# Patient Record
Sex: Male | Born: 1985 | Race: Black or African American | Hispanic: No | Marital: Single | Smoking: Current every day smoker
Health system: Southern US, Community
[De-identification: ages and names within clinical notes are randomized; demographics above are authoritative.]

---
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
## 2006-08-17 ENCOUNTER — Emergency Department (HOSPITAL_COMMUNITY): Admission: EM | Admit: 2006-08-17 | Discharge: 2006-08-17 | Payer: Self-pay | Admitting: Emergency Medicine

## 2008-03-04 ENCOUNTER — Emergency Department (HOSPITAL_COMMUNITY): Admission: EM | Admit: 2008-03-04 | Discharge: 2008-03-04 | Payer: Self-pay | Admitting: Emergency Medicine

## 2010-07-01 ENCOUNTER — Emergency Department: Payer: Self-pay | Admitting: Emergency Medicine

## 2011-10-21 ENCOUNTER — Emergency Department: Payer: Self-pay | Admitting: Emergency Medicine

## 2013-04-05 ENCOUNTER — Emergency Department (HOSPITAL_COMMUNITY): Payer: Self-pay

## 2013-04-05 ENCOUNTER — Encounter (HOSPITAL_COMMUNITY): Payer: Self-pay | Admitting: *Deleted

## 2013-04-05 ENCOUNTER — Emergency Department (HOSPITAL_COMMUNITY)
Admission: EM | Admit: 2013-04-05 | Discharge: 2013-04-05 | Disposition: A | Discharge status: Home / Self Care | Payer: Self-pay | Attending: Emergency Medicine | Admitting: Emergency Medicine

## 2013-04-05 DIAGNOSIS — F17200 Nicotine dependence, unspecified, uncomplicated: Secondary | ICD-10-CM | POA: Insufficient documentation

## 2013-04-05 DIAGNOSIS — S93409A Sprain of unspecified ligament of unspecified ankle, initial encounter: Secondary | ICD-10-CM | POA: Insufficient documentation

## 2013-04-05 DIAGNOSIS — X500XXA Overexertion from strenuous movement or load, initial encounter: Secondary | ICD-10-CM | POA: Insufficient documentation

## 2013-04-05 DIAGNOSIS — S93401A Sprain of unspecified ligament of right ankle, initial encounter: Secondary | ICD-10-CM

## 2013-04-05 DIAGNOSIS — M2391 Unspecified internal derangement of right knee: Secondary | ICD-10-CM

## 2013-04-05 DIAGNOSIS — Y929 Unspecified place or not applicable: Secondary | ICD-10-CM | POA: Insufficient documentation

## 2013-04-05 DIAGNOSIS — Y9302 Activity, running: Secondary | ICD-10-CM | POA: Insufficient documentation

## 2013-04-05 DIAGNOSIS — M2390 Unspecified internal derangement of unspecified knee: Secondary | ICD-10-CM | POA: Insufficient documentation

## 2013-04-05 DIAGNOSIS — W010XXA Fall on same level from slipping, tripping and stumbling without subsequent striking against object, initial encounter: Secondary | ICD-10-CM | POA: Insufficient documentation

## 2013-04-05 MED ORDER — TRAMADOL HCL 50 MG PO TABS: 100.0000 mg | ORAL_TABLET | Freq: Four times a day (QID) | ORAL | Status: DC | PRN

## 2013-04-05 MED ORDER — TRAMADOL HCL 50 MG PO TABS
100.0000 mg | ORAL_TABLET | Freq: Once | ORAL | Status: AC
  Administered 2013-04-05: 100 mg via ORAL
  Filled 2013-04-05: qty 2

## 2013-04-05 MED ORDER — IBUPROFEN 800 MG PO TABS
800.0000 mg | ORAL_TABLET | Freq: Once | ORAL | Status: AC
  Administered 2013-04-05: 800 mg via ORAL
  Filled 2013-04-05: qty 1

## 2013-04-05 NOTE — ED Notes (Signed)
Running and tripped last night, Pain rt ankle , lat malleolus

## 2013-04-05 NOTE — ED Notes (Signed)
Pt alert & oriented x4, stable gait. Patient given discharge instructions, paperwork & prescription(s). Patient  instructed to stop at the registration desk to finish any additional paperwork. Patient verbalized understanding. Pt left department w/ no further questions. 

## 2013-04-05 NOTE — ED Provider Notes (Signed)
History     CSN: [id number]  Arrival date & time 04/05/13  1952   First MD Initiated Contact with Patient 04/05/13 2037      Chief Complaint  Patient presents with  . Ankle Pain    (Consider location/radiation/quality/duration/timing/severity/associated sxs/prior treatment) HPI Patient states last night he was running and he tripped and twisted his right ankle. He denies hitting his head, hurting his wrist or any other injuries other than he states he is getting some pain in his right knee when he moves his leg a certain way. He states his ankle hurts so bad he is unable to bear weight on that leg. He has not started any treatment for it. He denies any prior injury of his ankle.   PCP none Orthopedist Dr Hilda Lias fixed broken finger in the past  History reviewed. No pertinent past medical history.  History reviewed. No pertinent past surgical history.  History reviewed. No pertinent family history.  History  Substance Use Topics  . Smoking status: Current Every Day Smoker  . Smokeless tobacco: Not on file  . Alcohol Use: Yes  employed in landscaping     Review of Systems  All other systems reviewed and are negative.    Allergies  Review of patient's allergies indicates no known allergies.  Home Medications  None   BP 116/69  Pulse 74  Temp(Src) 98 F (36.7 C) (Oral)  Resp 24  Ht 6\' 3"  (1.905 m)  Wt 158 lb (71.668 kg)  BMI 19.75 kg/m2  SpO2 99%  Vital signs normal    Physical Exam  Nursing note and vitals reviewed. Constitutional: He is oriented to person, place, and time. He appears well-developed and well-nourished.  Non-toxic appearance. He does not appear ill. No distress.  HENT:  Head: Normocephalic and atraumatic.  Nose: Nose normal. No mucosal edema or rhinorrhea.  Mouth/Throat: Mucous membranes are normal. No dental abscesses or edematous.  Eyes: Conjunctivae and EOM are normal. Pupils are equal, round, and reactive to light.  Neck: Normal  range of motion and full passive range of motion without pain. Neck supple.  Pulmonary/Chest: Effort normal and breath sounds normal. No respiratory distress. He has no rhonchi. He exhibits no crepitus.  Abdominal: Normal appearance.  Musculoskeletal: He exhibits edema and tenderness.  Patient has mild swelling of his right knee consistent with a small effusion compared to his left. He is nontender to palpation over the proximal fibula or the lateral aspect of the knee. He's nontender in his lower leg. He is not tender over the medial malleolus of his right ankle. His foot is nontender. He has good distal pulses and capillary refill. He does have swelling and tenderness around his medial malleolus of the right ankle. This reproduces his complaints of pain.  Neurological: He is alert and oriented to person, place, and time. He has normal strength. No cranial nerve deficit.  Skin: Skin is warm, dry and intact. No rash noted. No erythema. No pallor.  Psychiatric: He has a normal mood and affect. His speech is normal and behavior is normal. His mood appears not anxious.    ED Course  Procedures (including critical care time)  Medications  ibuprofen (ADVIL,MOTRIN) tablet 800 mg (not administered)  traMADol (ULTRAM) tablet 100 mg (not administered)   Pt placed in ASO, knee immobilizer and crutches   Dg Ankle Complete Right  04/05/2013   *RADIOLOGY REPORT*  Clinical Data: Injury  RIGHT ANKLE - COMPLETE 3+ VIEW  Comparison: None.  Findings: No  acute fracture and no dislocation.  Unremarkable soft tissues.  IMPRESSION: No acute bony pathology.   Original Report Authenticated By: Jolaine Click, M.D.   Dg Knee Complete 4 Views Right  04/05/2013   *RADIOLOGY REPORT*  Clinical Data: Fall with pain.  Fluid in joint  RIGHT KNEE - COMPLETE 4+ VIEW  Comparison: None.  Findings: Normal bony mineralization and alignment.  Negative for joint effusion or fracture.  No focal osseous lesion or focal soft tissue  swelling is appreciated.  IMPRESSION: No acute bony abnormality.   Original Report Authenticated By: Britta Mccreedy, M.D.     1. Sprain of ankle, right, initial encounter   2. Internal derangement of knee joint, right    New Prescriptions   TRAMADOL (ULTRAM) 50 MG TABLET    Take 2 tablets (100 mg total) by mouth every 6 (six) hours as needed.    Plan discharge  Devoria Albe, MD, FACEP  MDM          Ward Givens, MD 04/05/13 2129

## 2014-03-17 ENCOUNTER — Emergency Department: Payer: Self-pay | Admitting: Emergency Medicine

## 2014-03-22 LAB — WOUND CULTURE

## 2015-06-20 ENCOUNTER — Encounter: Payer: Self-pay | Admitting: Emergency Medicine

## 2015-06-20 ENCOUNTER — Emergency Department
Admission: EM | Admit: 2015-06-20 | Discharge: 2015-06-20 | Disposition: A | Discharge status: Home / Self Care | Payer: Self-pay | Attending: Emergency Medicine | Admitting: Emergency Medicine

## 2015-06-20 DIAGNOSIS — Z720 Tobacco use: Secondary | ICD-10-CM | POA: Insufficient documentation

## 2015-06-20 DIAGNOSIS — L0231 Cutaneous abscess of buttock: Secondary | ICD-10-CM | POA: Insufficient documentation

## 2015-06-20 DIAGNOSIS — Z79899 Other long term (current) drug therapy: Secondary | ICD-10-CM | POA: Insufficient documentation

## 2015-06-20 MED ORDER — HYDROCODONE-ACETAMINOPHEN 5-325 MG PO TABS: 1.0000 | ORAL_TABLET | ORAL | Status: DC | PRN

## 2015-06-20 MED ORDER — LIDOCAINE-EPINEPHRINE (PF) 1 %-1:200000 IJ SOLN
30.0000 mL | Freq: Once | INTRAMUSCULAR | Status: AC
  Administered 2015-06-20: 30 mL
  Filled 2015-06-20: qty 30

## 2015-06-20 MED ORDER — SULFAMETHOXAZOLE-TRIMETHOPRIM 800-160 MG PO TABS: 1.0000 | ORAL_TABLET | Freq: Two times a day (BID) | ORAL | Status: DC

## 2015-06-20 MED ORDER — KETOROLAC TROMETHAMINE 60 MG/2ML IM SOLN
60.0000 mg | Freq: Once | INTRAMUSCULAR | Status: AC
  Administered 2015-06-20: 60 mg via INTRAMUSCULAR
  Filled 2015-06-20: qty 2

## 2015-06-20 NOTE — ED Notes (Signed)
Patient ambulatory to triage with steady gait, without difficulty or distress noted; pt reports abscess to buttock x 4-5 days; st hx of same

## 2015-06-20 NOTE — ED Notes (Signed)
States he developed an abscess area to buttocks about 4-5 days ago. Hx of same.

## 2015-06-20 NOTE — Discharge Instructions (Signed)

## 2015-06-20 NOTE — ED Provider Notes (Signed)
[facility] Emergency Department Provider Note ____________________________________________  Time seen: Approximately 7:08 AM  I have reviewed the triage vital signs and the nursing notes.   HISTORY  Chief Complaint Abscess   HPI Chad Perez is a 29 y.o. male who presents to the emergency department for evaluation of an abscess to the left buttock. Abscess has been present for about 5 days and worsening. He has had several abscesses in the past, but this one is the worst. He is not taking anything for pain.  History reviewed. No pertinent past medical history.  There are no active problems to display for this patient.   History reviewed. No pertinent past surgical history.  Current Outpatient Rx  Name  Route  Sig  Dispense  Refill  . HYDROcodone-acetaminophen (NORCO/VICODIN) 5-325 MG per tablet   Oral   Take 1 tablet by mouth every 4 (four) hours as needed.   12 tablet   0   . sulfamethoxazole-trimethoprim (BACTRIM DS,SEPTRA DS) 800-160 MG per tablet   Oral   Take 1 tablet by mouth 2 (two) times daily.   20 tablet   0   . traMADol (ULTRAM) 50 MG tablet   Oral   Take 2 tablets (100 mg total) by mouth every 6 (six) hours as needed.   16 tablet   0     Allergies Review of patient's allergies indicates no known allergies.  No family history on file.  Social History Social History  Substance Use Topics  . Smoking status: Current Every Day Smoker  . Smokeless tobacco: None  . Alcohol Use: Yes    Review of Systems   Constitutional: No fever/chills Eyes: No visual changes. ENT: No congestion or rhinorrhea Cardiovascular: Denies chest pain. Respiratory: Denies shortness of breath. Gastrointestinal: No abdominal pain.  No nausea, no vomiting.  No diarrhea.  No constipation. Genitourinary: Negative for dysuria. Musculoskeletal: Negative for back pain. Skin: Abscess to the left buttock Neurological: Negative for headaches, focal  weakness or numbness.  10-point ROS otherwise negative.  ____________________________________________   PHYSICAL EXAM:  VITAL SIGNS: ED Triage Vitals  Enc Vitals Group     BP 06/20/15 0555 145/80 mmHg     Pulse Rate 06/20/15 0555 106     Resp 06/20/15 0555 18     Temp 06/20/15 0555 98.1 F (36.7 C)     Temp Source 06/20/15 0555 Oral     SpO2 06/20/15 0555 98 %     Weight 06/20/15 0555 160 lb (72.576 kg)     Height 06/20/15 0555  (1.905 m)     Head Cir --      Peak Flow --      Pain Score 06/20/15 0556 10     Pain Loc --      Pain Edu? --      Excl. in GC? --     Constitutional: Alert and oriented. Well appearing and in no acute distress. Eyes: Conjunctivae are normal. PERRL. EOMI. Head: Atraumatic. Nose: No congestion/rhinnorhea. Mouth/Throat: Mucous membranes are moist.  Oropharynx non-erythematous. No oral lesions. Neck: No stridor. Cardiovascular: Normal rate, regular rhythm.  Good peripheral circulation. Respiratory: Normal respiratory effort.  No retractions. Lungs CTAB. Gastrointestinal: Soft and nontender. No distention. No abdominal bruits.  Musculoskeletal: No lower extremity tenderness nor edema.  No joint effusions. Neurologic:  Normal speech and language. No gross focal neurologic deficits are appreciated. Speech is normal. No gait instability. Skin:  Fluctuant, indurated area on the left buttock--no induration at  the rectum or scrotum; Negative for petechiae.  Psychiatric: Mood and affect are normal. Speech and behavior are normal.  ____________________________________________   LABS (all labs ordered are listed, but only abnormal results are displayed)  Labs Reviewed - No data to display ____________________________________________  EKG   ____________________________________________  RADIOLOGY   ____________________________________________   PROCEDURES  Procedure(s) performed:  INCISION AND DRAINAGE Performed by: Kem Boroughs Consent: Verbal consent obtained. Risks and benefits: risks, benefits and alternatives were discussed Type: abscess  Body area: left abscess  Anesthesia: local infiltration  Incision was made with a scalpel.  Local anesthetic: lidocaine 1% with epinephrine  Anesthetic total: 3 ml  Complexity: complex Blunt dissection to break up loculations  Drainage: purulent  Drainage amount: copious amount  Packing material: 1/4 in iodoform gauze  Patient tolerance: Patient tolerated the procedure well with no immediate complications.    ____________________________________________   INITIAL IMPRESSION / ASSESSMENT AND PLAN / ED COURSE  Pertinent labs & imaging results that were available during my care of the patient were reviewed by me and considered in my medical decision making (see chart for details).  Patient was advised to return in 2 days for recheck and to have packing removed. He was advised to return sooner if the pain gets worse or the infection spreads to the rectum or scrotum. He was advised to take the antibiotic until finished, even if feeling better.  ____________________________________________   FINAL CLINICAL IMPRESSION(S) / ED DIAGNOSES  Final diagnoses:  Abscess of buttock, left      Chinita Pester, FNP 06/20/15 1249  Sharyn Creamer, MD 06/20/15 1330

## 2017-02-10 ENCOUNTER — Encounter: Payer: Self-pay | Admitting: *Deleted

## 2017-02-10 ENCOUNTER — Emergency Department
Admission: EM | Admit: 2017-02-10 | Discharge: 2017-02-10 | Disposition: A | Discharge status: Home / Self Care | Payer: Self-pay | Attending: Emergency Medicine | Admitting: Emergency Medicine

## 2017-02-10 DIAGNOSIS — J020 Streptococcal pharyngitis: Secondary | ICD-10-CM | POA: Insufficient documentation

## 2017-02-10 DIAGNOSIS — F17200 Nicotine dependence, unspecified, uncomplicated: Secondary | ICD-10-CM | POA: Insufficient documentation

## 2017-02-10 LAB — POCT RAPID STREP A: STREPTOCOCCUS, GROUP A SCREEN (DIRECT): POSITIVE — AB

## 2017-02-10 MED ORDER — AMOXICILLIN 500 MG PO CAPS: 500.0000 mg | ORAL_CAPSULE | Freq: Three times a day (TID) | ORAL | 0 refills | Status: DC

## 2017-02-10 MED ORDER — AMOXICILLIN 500 MG PO CAPS
500.0000 mg | ORAL_CAPSULE | Freq: Once | ORAL | Status: AC
  Administered 2017-02-10: 500 mg via ORAL
  Filled 2017-02-10: qty 1

## 2017-02-10 NOTE — ED Notes (Signed)
See triage note  States he developed sore throat couple of days ago  Unsure of fever but has had chills  Increased pain with swallowing today

## 2017-02-10 NOTE — ED Triage Notes (Signed)
States sore throat since Monday, maintaining secretions in no distress

## 2017-02-10 NOTE — Discharge Instructions (Signed)
Follow-up with [facility] if any continued problems with your throat or any worsening. Increase fluids. He may take Tylenol or ibuprofen as needed for throat pain or fever. Begin taking amoxicillin 3 times a day for the next 10 days. Do not stop until your bottle is completely finished.

## 2017-02-10 NOTE — ED Provider Notes (Signed)
[facility] Emergency Department Provider Note   ____________________________________________   First MD Initiated Contact with Patient 02/10/17 1246     (approximate)  I have reviewed the triage vital signs and the nursing notes.   HISTORY  Chief Complaint Sore Throat    HPI Chad Perez is a 31 y.o. male is here complaining of sore throat that began 3 days ago.Patient is unaware of any fever but has had subjective chills. Patient has increased pain with swallowing but has been able to maintain swallowing his secretions without any difficulty and continues to talk. Currently he rates his pain as a 6 out of 10.   History reviewed. No pertinent past medical history.  There are no active problems to display for this patient.   History reviewed. No pertinent surgical history.  Prior to Admission medications   Medication Sig Start Date End Date Taking? Authorizing Provider  amoxicillin (AMOXIL) 500 MG capsule Take 1 capsule (500 mg total) by mouth 3 (three) times daily. 02/10/17   Tommi Rumps, PA-C    Allergies Patient has no known allergies.  History reviewed. No pertinent family history.  Social History Social History  Substance Use Topics  . Smoking status: Current Every Day Smoker  . Smokeless tobacco: Not on file  . Alcohol use Yes    Review of Systems  Constitutional: Subjective fever/chills Eyes: No visual changes. ENT: Positive sore throat. Cardiovascular: Denies chest pain. Respiratory: Denies shortness of breath. Gastrointestinal: No abdominal pain.  No nausea, no vomiting.   Musculoskeletal: Negative for back pain. Skin: Negative for rash. Neurological: Positive for headaches, no focal weakness or numbness.   ____________________________________________   PHYSICAL EXAM:  VITAL SIGNS: ED Triage Vitals  Enc Vitals Group     BP 02/10/17 1231 (!) 143/89     Pulse Rate 02/10/17 1231 75     Resp 02/10/17 1231 18       Temp 02/10/17 1231 98.6 F (37 C)     Temp Source 02/10/17 1231 Oral     SpO2 02/10/17 1231 98 %     Weight 02/10/17 1231 160 lb (72.6 kg)     Height 02/10/17 1231  (1.905 m)     Head Circumference --      Peak Flow --      Pain Score 02/10/17 1234 10     Pain Loc --      Pain Edu? --      Excl. in GC? --     Constitutional: Alert and oriented. Well appearing and in no acute distress. Eyes: Conjunctivae are normal. PERRL. EOMI. Head: Atraumatic. Nose: No congestion/rhinnorhea. Mouth/Throat: Mucous membranes are moist.  Oropharynx Moderate erythema is noted with tonsillar enlargement but no exudate was seen. Neck: No stridor.   Hematological/Lymphatic/Immunilogical: No cervical lymphadenopathy. Cardiovascular: Normal rate, regular rhythm. Grossly normal heart sounds.  Good peripheral circulation. Respiratory: Normal respiratory effort.  No retractions. Lungs CTAB. Musculoskeletal: Moves upper and lower extremities without any difficulty. Normal gait was noted. Neurologic:  Normal speech and language. No gross focal neurologic deficits are appreciated. No gait instability. Skin:  Skin is warm, dry and intact. No rash noted. Psychiatric: Mood and affect are normal. Speech and behavior are normal.  ____________________________________________   LABS (all labs ordered are listed, but only abnormal results are displayed)  Labs Reviewed  POCT RAPID STREP A - Abnormal; Notable for the following:       Result Value   Streptococcus, Group A Screen (Direct)  POSITIVE (*)    All other components within normal limits    PROCEDURES  Procedure(s) performed: None  Procedures  Critical Care performed: No  ____________________________________________   INITIAL IMPRESSION / ASSESSMENT AND PLAN / ED COURSE  Pertinent labs & imaging results that were available during my care of the patient were reviewed by me and considered in my medical decision making (see chart for  details).  Patient was made with a strep test was positive. Patient was given amoxicillin 500 mg in the department to make sure he is able to swallow his medications without any difficulty. He is also to continue taking amoxicillin 500 mg 3 times a day for 10 days. He may take Tylenol or ibuprofen as needed for throat pain. He is to follow-up with [facility] if any continued problems.      ____________________________________________   FINAL CLINICAL IMPRESSION(S) / ED DIAGNOSES  Final diagnoses:  Strep pharyngitis      NEW MEDICATIONS STARTED DURING THIS VISIT:  Discharge Medication List as of 02/10/2017  1:42 PM    START taking these medications   Details  amoxicillin (AMOXIL) 500 MG capsule Take 1 capsule (500 mg total) by mouth 3 (three) times daily., Starting Wed 02/10/2017, Print         Note:  This document was prepared using Dragon voice recognition software and may include unintentional dictation errors.    Tommi Rumps, PA-C 02/10/17 1611    Sharman Cheek, MD 02/15/17 [phone]

## 2020-02-19 ENCOUNTER — Encounter: Payer: Self-pay | Admitting: Emergency Medicine

## 2020-02-19 ENCOUNTER — Other Ambulatory Visit: Payer: Self-pay

## 2020-02-19 ENCOUNTER — Emergency Department: Payer: Self-pay

## 2020-02-19 ENCOUNTER — Emergency Department
Admission: EM | Admit: 2020-02-19 | Discharge: 2020-02-20 | Disposition: A | Discharge status: Home / Self Care | Payer: Self-pay | Attending: Emergency Medicine | Admitting: Emergency Medicine

## 2020-02-19 DIAGNOSIS — M542 Cervicalgia: Secondary | ICD-10-CM | POA: Insufficient documentation

## 2020-02-19 DIAGNOSIS — M25562 Pain in left knee: Secondary | ICD-10-CM | POA: Insufficient documentation

## 2020-02-19 DIAGNOSIS — Y999 Unspecified external cause status: Secondary | ICD-10-CM | POA: Insufficient documentation

## 2020-02-19 DIAGNOSIS — S20419A Abrasion of unspecified back wall of thorax, initial encounter: Secondary | ICD-10-CM | POA: Insufficient documentation

## 2020-02-19 DIAGNOSIS — H9201 Otalgia, right ear: Secondary | ICD-10-CM | POA: Insufficient documentation

## 2020-02-19 DIAGNOSIS — S02609A Fracture of mandible, unspecified, initial encounter for closed fracture: Secondary | ICD-10-CM

## 2020-02-19 DIAGNOSIS — Y939 Activity, unspecified: Secondary | ICD-10-CM | POA: Insufficient documentation

## 2020-02-19 DIAGNOSIS — Y929 Unspecified place or not applicable: Secondary | ICD-10-CM | POA: Insufficient documentation

## 2020-02-19 DIAGNOSIS — R0789 Other chest pain: Secondary | ICD-10-CM

## 2020-02-19 DIAGNOSIS — S00511A Abrasion of lip, initial encounter: Secondary | ICD-10-CM | POA: Insufficient documentation

## 2020-02-19 LAB — TROPONIN I (HIGH SENSITIVITY)
Troponin I (High Sensitivity): 8 ng/L (ref <18)
Troponin I (High Sensitivity): 13 ng/L (ref <18)

## 2020-02-19 LAB — CBC WITH DIFFERENTIAL/PLATELET
Abs Immature Granulocytes: 0.03 10*3/uL (ref 0.00–0.07)
Basophils Absolute: 0.1 10*3/uL (ref 0.0–0.1)
Basophils Relative: 1 %
Eosinophils Absolute: 0 10*3/uL (ref 0.0–0.5)
Eosinophils Relative: 0 %
HCT: 41.3 % (ref 39.0–52.0)
Hemoglobin: 15.1 g/dL (ref 13.0–17.0)
Immature Granulocytes: 0 %
Lymphocytes Relative: 15 %
Lymphs Abs: 1.8 10*3/uL (ref 0.7–4.0)
MCH: 33.6 pg (ref 26.0–34.0)
MCHC: 36.6 g/dL — ABNORMAL HIGH (ref 30.0–36.0)
MCV: 91.8 fL (ref 80.0–100.0)
Monocytes Absolute: 1 10*3/uL (ref 0.1–1.0)
Monocytes Relative: 8 %
Neutro Abs: 9.6 10*3/uL — ABNORMAL HIGH (ref 1.7–7.7)
Neutrophils Relative %: 76 %
Platelets: 343 10*3/uL (ref 150–400)
RBC: 4.5 MIL/uL (ref 4.22–5.81)
RDW: 13.6 % (ref 11.5–15.5)
WBC: 12.6 10*3/uL — ABNORMAL HIGH (ref 4.0–10.5)
nRBC: 0 % (ref 0.0–0.2)

## 2020-02-19 LAB — COMPREHENSIVE METABOLIC PANEL
ALT: 18 U/L (ref 0–44)
AST: 39 U/L (ref 15–41)
Albumin: 4 g/dL (ref 3.5–5.0)
Alkaline Phosphatase: 80 U/L (ref 38–126)
Anion gap: 15 (ref 5–15)
BUN: 11 mg/dL (ref 6–20)
CO2: 21 mmol/L — ABNORMAL LOW (ref 22–32)
Calcium: 8.7 mg/dL — ABNORMAL LOW (ref 8.9–10.3)
Chloride: 105 mmol/L (ref 98–111)
Creatinine, Ser: 0.76 mg/dL (ref 0.61–1.24)
GFR calc Af Amer: >60 mL/min (ref >60)
GFR calc non Af Amer: >60 mL/min (ref >60)
Glucose, Bld: 99 mg/dL (ref 70–99)
Potassium: 4 mmol/L (ref 3.5–5.1)
Sodium: 141 mmol/L (ref 135–145)
Total Bilirubin: 0.9 mg/dL (ref 0.3–1.2)
Total Protein: 7.5 g/dL (ref 6.5–8.1)

## 2020-02-19 MED ORDER — TETANUS-DIPHTH-ACELL PERTUSSIS 5-2.5-18.5 LF-MCG/0.5 IM SUSP
0.5000 mL | Freq: Once | INTRAMUSCULAR | Status: AC
  Administered 2020-02-19: 20:00:00 0.5 mL via INTRAMUSCULAR
  Filled 2020-02-19: qty 0.5

## 2020-02-19 MED ORDER — IOHEXOL 300 MG/ML  SOLN
75.0000 mL | Freq: Once | INTRAMUSCULAR | Status: AC | PRN
  Administered 2020-02-19: 22:00:00 75 mL via INTRAVENOUS
  Filled 2020-02-19: qty 75

## 2020-02-19 MED ORDER — HYDROCODONE-ACETAMINOPHEN 5-325 MG PO TABS: 1.0000 | ORAL_TABLET | Freq: Four times a day (QID) | ORAL | 0 refills | Status: AC | PRN

## 2020-02-19 MED ORDER — CIPROFLOXACIN-DEXAMETHASONE 0.3-0.1 % OT SUSP: 4.0000 [drp] | Freq: Two times a day (BID) | OTIC | 0 refills | Status: AC

## 2020-02-19 MED ORDER — OXYCODONE-ACETAMINOPHEN 5-325 MG PO TABS
1.0000 | ORAL_TABLET | Freq: Once | ORAL | Status: AC
  Administered 2020-02-19: 20:00:00 1 via ORAL
  Filled 2020-02-19: qty 1

## 2020-02-19 MED ORDER — CEPHALEXIN 500 MG PO CAPS: 500.0000 mg | ORAL_CAPSULE | Freq: Three times a day (TID) | ORAL | 0 refills | Status: AC
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
## 2020-02-19 NOTE — Discharge Instructions (Addendum)
Please 4 drops into right ear twice daily for the next 7 days. You have a right-sided mandibular fossa fracture which extends to the middle ear.  Please make follow-up appointment with Dr.Juengal who is an ENT physician. Please make a follow-up appointment with Dr. Darrold Junker regarding left-sided chest wall pain and EKG findings in the emergency department. Norco can be taken for pain. Please take Keflex 3 times daily for the next 7 days to keep abrasions on face from getting infected.
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
## 2020-02-19 NOTE — ED Provider Notes (Signed)
ED ECG REPORT I, Dionne Bucy, the attending physician, personally viewed and interpreted this ECG.  Date: 02/19/2020 EKG Time: 2017 Rate: 46 Rhythm: normal sinus rhythm QRS Axis: normal Intervals: normal ST/T Wave abnormalities: Nonspecific anterior T wave abnormality Narrative Interpretation: Nonspecific T wave abnormality no evidence of acute ischemia    Dionne Bucy, MD 02/19/20 2325
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
## 2020-02-20 MED ORDER — ONDANSETRON 4 MG PO TBDP
4.0000 mg | ORAL_TABLET | Freq: Once | ORAL | Status: AC
  Administered 2020-02-20: 4 mg via ORAL
  Filled 2020-02-20: qty 1

## 2020-04-02 ENCOUNTER — Encounter: Payer: Self-pay | Admitting: Emergency Medicine

## 2020-04-02 ENCOUNTER — Emergency Department
Admission: EM | Admit: 2020-04-02 | Discharge: 2020-04-02 | Disposition: A | Discharge status: Home / Self Care | Payer: Medicaid Other | Attending: Emergency Medicine | Admitting: Emergency Medicine

## 2020-04-02 ENCOUNTER — Other Ambulatory Visit: Payer: Self-pay

## 2020-04-02 DIAGNOSIS — F17210 Nicotine dependence, cigarettes, uncomplicated: Secondary | ICD-10-CM | POA: Insufficient documentation

## 2020-04-02 DIAGNOSIS — L0231 Cutaneous abscess of buttock: Secondary | ICD-10-CM

## 2020-04-02 MED ORDER — SULFAMETHOXAZOLE-TRIMETHOPRIM 800-160 MG PO TABS: 1.0000 | ORAL_TABLET | Freq: Two times a day (BID) | ORAL | 0 refills | Status: DC

## 2020-04-02 MED ORDER — OXYCODONE-ACETAMINOPHEN 7.5-325 MG PO TABS: 1.0000 | ORAL_TABLET | Freq: Four times a day (QID) | ORAL | 0 refills | Status: DC | PRN

## 2020-04-02 MED ORDER — IBUPROFEN 600 MG PO TABS: 600.0000 mg | ORAL_TABLET | Freq: Three times a day (TID) | ORAL | 0 refills | Status: DC | PRN

## 2020-04-02 NOTE — ED Triage Notes (Signed)
Pt reports abscess to his bottom side for the past week.

## 2020-04-02 NOTE — Discharge Instructions (Addendum)
Follow discharge care instruction take medication as directed. °

## 2020-04-02 NOTE — ED Provider Notes (Signed)
[facility] Emergency Department Provider Note   ____________________________________________   First MD Initiated Contact with Patient 04/02/20 1031     (approximate)  I have reviewed the triage vital signs and the nursing notes.   HISTORY  Chief Complaint Abscess    HPI Chad Perez is a 34 y.o. male patient complain abscess to the right buttock for the past week.  Patient has his recurrent abscess to the buttocks.  Patient denies drainage.  No fever associated with complaint.   Patient rates pain as a 10/10.  No palliative measure for complaint.     History reviewed. No pertinent past medical history.  There are no problems to display for this patient.   History reviewed. No pertinent surgical history.  Prior to Admission medications   Medication Sig Start Date End Date Taking? Authorizing Provider  ibuprofen (ADVIL) 600 MG tablet Take 1 tablet (600 mg total) by mouth every 8 (eight) hours as needed. 04/02/20   Joni Reining, PA-C  oxyCODONE-acetaminophen (PERCOCET) 7.5-325 MG tablet Take 1 tablet by mouth every 6 (six) hours as needed. 04/02/20   Joni Reining, PA-C  sulfamethoxazole-trimethoprim (BACTRIM DS) 800-160 MG tablet Take 1 tablet by mouth 2 (two) times daily. 04/02/20   Joni Reining, PA-C    Allergies Patient has no known allergies.  No family history on file.  Social History Social History   Tobacco Use  . Smoking status: Current Every Day Smoker  . Smokeless tobacco: Never Used  Substance Use Topics  . Alcohol use: Yes    Comment: weekend drinker per pt  . Drug use: Yes    Types: Marijuana    Review of Systems  Constitutional: No fever/chills Eyes: No visual changes. ENT: No sore throat. Cardiovascular: Denies chest pain. Respiratory: Denies shortness of breath. Gastrointestinal: No abdominal pain.  No nausea, no vomiting.  No diarrhea.  No constipation. Genitourinary: Negative for  dysuria. Musculoskeletal: Negative for back pain. Skin: Abscess right buttock. Neurological: Negative for headaches, focal weakness or numbness.   ____________________________________________   PHYSICAL EXAM:  VITAL SIGNS: ED Triage Vitals  Enc Vitals Group     BP 04/02/20 0942 (!) 137/101     Pulse Rate 04/02/20 0942 87     Resp 04/02/20 0942 20     Temp 04/02/20 0942 99.2 F (37.3 C)     Temp Source 04/02/20 0942 Oral     SpO2 04/02/20 0942 100 %     Weight 04/02/20 0939 155 lb (70.3 kg)     Height 04/02/20 0939 6\' 2"  (1.88 m)     Head Circumference --      Peak Flow --      Pain Score 04/02/20 0939 10     Pain Loc --      Pain Edu? --      Excl. in [place]? --    Constitutional: Alert and oriented. Well appearing and in no acute distress. Cardiovascular: Normal rate, regular rhythm. Grossly normal heart sounds.  Good peripheral circulation. Respiratory: Normal respiratory effort.  No retractions. Lungs CTAB. Skin:  Skin is warm, dry and intact. No rash noted.  Edema and erythema right buttocks.  Nonfluctuant.  No drainage. Psychiatric: Mood and affect are normal. Speech and behavior are normal.  ____________________________________________   LABS (all labs ordered are listed, but only abnormal results are displayed)  Labs Reviewed - No data to display ____________________________________________  EKG   ____________________________________________  RADIOLOGY  ED MD interpretation:  Official radiology report(s): No results found.  ____________________________________________   PROCEDURES  Procedure(s) performed (including Critical Care):  Procedures   ____________________________________________   INITIAL IMPRESSION / ASSESSMENT AND PLAN / ED COURSE  As part of my medical decision making, I reviewed the following data within the [facility]     Patient presents with pain and swelling to the right buttock.  Discussed with patient  rationale for not incised and drained since the lesion is nonfluctuant.  Patient given a prescription for Bactrim DS, ibuprofen, and Percocets.  Follow discharge care instructions.  Return if condition worsens.    Chad Perez was evaluated in Emergency Department on 04/02/2020 for the symptoms described in the history of present illness. He was evaluated in the context of the global COVID-19 pandemic, which necessitated consideration that the patient might be at risk for infection with the SARS-CoV-2 virus that causes COVID-19. Institutional protocols and algorithms that pertain to the evaluation of patients at risk for COVID-19 are in a state of rapid change based on information released by regulatory bodies including the CDC and federal and state organizations. These policies and algorithms were followed during the patient's care in the ED.       ____________________________________________   FINAL CLINICAL IMPRESSION(S) / ED DIAGNOSES  Final diagnoses:  Abscess of buttock, right     ED Discharge Orders         Ordered    sulfamethoxazole-trimethoprim (BACTRIM DS) 800-160 MG tablet  2 times daily     Discontinue  Reprint     04/02/20 1048    ibuprofen (ADVIL) 600 MG tablet  Every 8 hours PRN     Discontinue  Reprint     04/02/20 1048    oxyCODONE-acetaminophen (PERCOCET) 7.5-325 MG tablet  Every 6 hours PRN     Discontinue  Reprint     04/02/20 1048           Note:  This document was prepared using Dragon voice recognition software and may include unintentional dictation errors.    Sable Feil, PA-C 04/02/20 1059    Arta Silence, MD 04/02/20 1155

## 2020-04-02 NOTE — ED Notes (Signed)
See triage note  Presents with possible abscess area to buttocks  States he noticed the area about 1 week ago  States he has been using warm compresses without much relief
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
## 2021-12-06 ENCOUNTER — Emergency Department: Payer: BC Managed Care – PPO

## 2021-12-06 ENCOUNTER — Emergency Department
Admission: EM | Admit: 2021-12-06 | Discharge: 2021-12-06 | Disposition: A | Discharge status: Home / Self Care | Payer: BC Managed Care – PPO | Attending: Physician Assistant | Admitting: Physician Assistant

## 2021-12-06 ENCOUNTER — Encounter: Payer: Self-pay | Admitting: Emergency Medicine

## 2021-12-06 ENCOUNTER — Other Ambulatory Visit: Payer: Self-pay

## 2021-12-06 DIAGNOSIS — L0231 Cutaneous abscess of buttock: Secondary | ICD-10-CM | POA: Diagnosis present

## 2021-12-06 DIAGNOSIS — D72829 Elevated white blood cell count, unspecified: Secondary | ICD-10-CM | POA: Insufficient documentation

## 2021-12-06 DIAGNOSIS — Z23 Encounter for immunization: Secondary | ICD-10-CM | POA: Diagnosis not present

## 2021-12-06 LAB — BASIC METABOLIC PANEL
Anion gap: 9 (ref 5–15)
BUN: 15 mg/dL (ref 6–20)
CO2: 27 mmol/L (ref 22–32)
Calcium: 9.3 mg/dL (ref 8.9–10.3)
Chloride: 101 mmol/L (ref 98–111)
Creatinine, Ser: 0.79 mg/dL (ref 0.61–1.24)
GFR, Estimated: >60 mL/min (ref >60)
Glucose, Bld: 106 mg/dL — ABNORMAL HIGH (ref 70–99)
Potassium: 3.8 mmol/L (ref 3.5–5.1)
Sodium: 137 mmol/L (ref 135–145)

## 2021-12-06 LAB — CBC WITH DIFFERENTIAL/PLATELET
Abs Immature Granulocytes: 0.08 10*3/uL — ABNORMAL HIGH (ref 0.00–0.07)
Basophils Absolute: 0.1 10*3/uL (ref 0.0–0.1)
Basophils Relative: 0 %
Eosinophils Absolute: 0.1 10*3/uL (ref 0.0–0.5)
Eosinophils Relative: 1 %
HCT: 48.3 % (ref 39.0–52.0)
Hemoglobin: 17 g/dL (ref 13.0–17.0)
Immature Granulocytes: 1 %
Lymphocytes Relative: 11 %
Lymphs Abs: 1.8 10*3/uL (ref 0.7–4.0)
MCH: 33.1 pg (ref 26.0–34.0)
MCHC: 35.2 g/dL (ref 30.0–36.0)
MCV: 94 fL (ref 80.0–100.0)
Monocytes Absolute: 1.7 10*3/uL — ABNORMAL HIGH (ref 0.1–1.0)
Monocytes Relative: 10 %
Neutro Abs: 13.7 10*3/uL — ABNORMAL HIGH (ref 1.7–7.7)
Neutrophils Relative %: 77 %
Platelets: 343 10*3/uL (ref 150–400)
RBC: 5.14 MIL/uL (ref 4.22–5.81)
RDW: 13.2 % (ref 11.5–15.5)
WBC: 17.4 10*3/uL — ABNORMAL HIGH (ref 4.0–10.5)
nRBC: 0 % (ref 0.0–0.2)

## 2021-12-06 LAB — LACTIC ACID, PLASMA: Lactic Acid, Venous: 1.2 mmol/L (ref 0.5–1.9)

## 2021-12-06 MED ORDER — OXYCODONE-ACETAMINOPHEN 5-325 MG PO TABS: 1.0000 | ORAL_TABLET | Freq: Three times a day (TID) | ORAL | 0 refills | Status: AC | PRN

## 2021-12-06 MED ORDER — MORPHINE SULFATE (PF) 4 MG/ML IV SOLN
4.0000 mg | Freq: Once | INTRAVENOUS | Status: AC
  Administered 2021-12-06: 4 mg via INTRAVENOUS
  Filled 2021-12-06: qty 1

## 2021-12-06 MED ORDER — LIDOCAINE HCL (PF) 1 % IJ SOLN
10.0000 mL | Freq: Once | INTRAMUSCULAR | Status: AC
  Administered 2021-12-06: 10 mL
  Filled 2021-12-06: qty 10

## 2021-12-06 MED ORDER — OXYCODONE-ACETAMINOPHEN 5-325 MG PO TABS
1.0000 | ORAL_TABLET | Freq: Once | ORAL | Status: AC
  Administered 2021-12-06: 1 via ORAL
  Filled 2021-12-06: qty 1

## 2021-12-06 MED ORDER — SULFAMETHOXAZOLE-TRIMETHOPRIM 800-160 MG PO TABS
1.0000 | ORAL_TABLET | Freq: Once | ORAL | Status: AC
  Administered 2021-12-06: 1 via ORAL
  Filled 2021-12-06: qty 1

## 2021-12-06 MED ORDER — TETANUS-DIPHTH-ACELL PERTUSSIS 5-2.5-18.5 LF-MCG/0.5 IM SUSY
0.5000 mL | PREFILLED_SYRINGE | Freq: Once | INTRAMUSCULAR | Status: AC
  Administered 2021-12-06: 0.5 mL via INTRAMUSCULAR
  Filled 2021-12-06: qty 0.5

## 2021-12-06 MED ORDER — IOHEXOL 300 MG/ML  SOLN
80.0000 mL | Freq: Once | INTRAMUSCULAR | Status: AC | PRN
  Administered 2021-12-06: 80 mL via INTRAVENOUS
  Filled 2021-12-06: qty 80

## 2021-12-06 MED ORDER — SULFAMETHOXAZOLE-TRIMETHOPRIM 800-160 MG PO TABS: 1.0000 | ORAL_TABLET | Freq: Two times a day (BID) | ORAL | 0 refills | Status: AC

## 2021-12-06 MED ORDER — ONDANSETRON HCL 4 MG/2ML IJ SOLN
4.0000 mg | Freq: Once | INTRAMUSCULAR | Status: AC
  Administered 2021-12-06: 4 mg via INTRAVENOUS
  Filled 2021-12-06: qty 2

## 2021-12-06 NOTE — Discharge Instructions (Signed)
Take the antibiotic as directed and the pain medicine as needed. Apply warm compresses to promote healing. Follow-up with this Emergency Department for wound check and packing removal.

## 2021-12-06 NOTE — ED Triage Notes (Signed)
Pt via POV from home. Pt c/o rectal abscess that he noticed on Tuesday pt states that pain is progressively getting worse and is getting bigger. Denies any drainage at this time. Pt is A&OX4 and NAD

## 2021-12-06 NOTE — ED Notes (Signed)
See triage note. Pt ambulatory to room. Pt in NAD.

## 2021-12-06 NOTE — ED Provider Notes (Signed)
[facility] Emergency Department Provider Note     None    (approximate)   History   Abscess   HPI  Chad Perez is a 36 y.o. male with a noncontributory medical history, presents to the ED for evaluation of progressive tenderness, firmness, and swelling to the right buttocks.  Patient initially noted increasing pain on Tuesday.  He denies any spontaneous drainage from the buttocks.  He also denies any difficulty passing stool or urine.  Patient reports history of intermittent buttocks abscesses which generally resolve spontaneously.  He was last evaluated here in 2021 for similar complaint, and discharged with antibiotics and pain medicines without a local I&D procedure.  Patient is without fevers, chills, sweats, chest pain, or shortness of breath.     Physical Exam   Triage Vital Signs: ED Triage Vitals  Enc Vitals Group     BP 12/06/21 1102 (!) 139/106     Pulse Rate 12/06/21 1102 95     Resp 12/06/21 1102 16     Temp 12/06/21 1102 98.8 F (37.1 C)     Temp Source 12/06/21 1102 Oral     SpO2 12/06/21 1102 97 %     Weight 12/06/21 1100 160 lb (72.6 kg)     Height 12/06/21 1100 6\' 3"  (1.905 m)     Head Circumference --      Peak Flow --      Pain Score 12/06/21 1100 10     Pain Loc --      Pain Edu? --      Excl. in [place]? --     Most recent vital signs: Vitals:   12/06/21 1406 12/06/21 1554  BP: 125/81 133/88  Pulse: 83 88  Resp: 16 16  Temp:    SpO2: 96% 97%    General Awake, no distress.  CV:  Good peripheral perfusion.  RESP:  Normal effort.  ABD:  No distention.  SKIN:  Right buttocks with area of firmness and swelling extending at least 6 x 4 cm.  No obvious area of pointing or fluctuance.  The tenderness and firmness extends medially towards the anus.  Patient has multiple scars and healed papules over the buttocks bilaterally.   ED Results / Procedures / Treatments   Labs (all labs ordered are listed, but only abnormal  results are displayed) Labs Reviewed  BASIC METABOLIC PANEL - Abnormal; Notable for the following components:      Result Value   Glucose, Bld 106 (*)    All other components within normal limits  CBC WITH DIFFERENTIAL/PLATELET - Abnormal; Notable for the following components:   WBC 17.4 (*)    Neutro Abs 13.7 (*)    Monocytes Absolute 1.7 (*)    Abs Immature Granulocytes 0.08 (*)    All other components within normal limits  LACTIC ACID, PLASMA  LACTIC ACID, PLASMA    EKG   RADIOLOGY  I personally viewed and evaluated these images as part of my medical decision making, as well as reviewing the written report by the radiologist.  ED Provider Interpretation: agree with report}  CT PELVIS W CONTRAST  Result Date: 12/06/2021 CLINICAL DATA:  Anal abscess EXAM: CT PELVIS WITH CONTRAST TECHNIQUE: Multidetector CT imaging of the pelvis was performed using the standard protocol following the bolus administration of intravenous contrast. RADIATION DOSE REDUCTION: This exam was performed according to the departmental dose-optimization program which includes automated exposure control, adjustment of the mA and/or kV according to  patient size and/or use of iterative reconstruction technique. CONTRAST:  67mL OMNIPAQUE IOHEXOL 300 MG/ML  SOLN COMPARISON:  None. FINDINGS: Urinary Tract:  Urinary bladder appears normal Bowel: Visualized bowel is unremarkable. No rectal wall thickening. Vascular/Lymphatic: No bulky lymphadenopathy visualized. Mild atherosclerotic disease noted. Reproductive:  Prostate gland unremarkable. Other: Large irregular and multiloculated peripherally enhancing abscess collection identified in the subcutaneous plane of the right medial gluteus measuring up to 5.6 x 5.6 x 6 cm in AP, transverse and craniocaudal dimensions. Apparent communication with the right anal wall. Extensive surrounding fat stranding. Musculoskeletal: No suspicious bone lesions identified. IMPRESSION: Large  right gluteal/perianal complex abscess as described. Extensive surrounding fat stranding suggesting edema/cellulitis. Electronically Signed   By: Ofilia Neas M.D.   On: 12/06/2021 13:39     PROCEDURES:  Critical Care performed: No  ..Incision and Drainage  Date/Time: 12/06/2021 3:47 PM Performed by: Melvenia Needles, PA-C Authorized by: Melvenia Needles, PA-C   Consent:    Consent obtained:  Verbal   Consent given by:  Patient   Risks, benefits, and alternatives were discussed: yes     Risks discussed:  Incomplete drainage and pain   Alternatives discussed:  Referral Universal protocol:    Procedure explained and questions answered to patient or proxy's satisfaction: yes     Test results available : yes     Imaging studies available: yes     Site/side marked: yes     Patient identity confirmed:  Verbally with patient Location:    Type:  Abscess   Size:  6x6 cm   Location:  Anogenital   Anogenital location:  Gluteal cleft Pre-procedure details:    Skin preparation:  Povidone-iodine Sedation:    Sedation type:  None Anesthesia:    Anesthesia method:  Local infiltration   Local anesthetic:  Lidocaine 1% w/o epi Procedure type:    Complexity:  Complex Procedure details:    Ultrasound guidance: no     Needle aspiration: no     Incision types:  Single straight   Incision depth:  Subcutaneous   Wound management:  Probed and deloculated and irrigated with saline   Drainage:  Purulent   Drainage amount:  Copious   Wound treatment:  Wound left open   Packing material: 1 in iodoform gauze. Post-procedure details:    Procedure completion:  Tolerated well, no immediate complications   MEDICATIONS ORDERED IN ED: Medications  iohexol (OMNIPAQUE) 300 MG/ML solution 80 mL (80 mLs Intravenous Contrast Given 12/06/21 1302)  ondansetron (ZOFRAN) injection 4 mg (4 mg Intravenous Given 12/06/21 1358)  morphine (PF) 4 MG/ML injection 4 mg (4 mg Intravenous Given  12/06/21 1359)  lidocaine (PF) (XYLOCAINE) 1 % injection 10 mL (10 mLs Infiltration Given by Other 12/06/21 1404)  Tdap (BOOSTRIX) injection 0.5 mL (0.5 mLs Intramuscular Given 12/06/21 1402)  sulfamethoxazole-trimethoprim (BACTRIM DS) 800-160 MG per tablet 1 tablet (1 tablet Oral Given 12/06/21 1550)  oxyCODONE-acetaminophen (PERCOCET/ROXICET) 5-325 MG per tablet 1 tablet (1 tablet Oral Given 12/06/21 1550)     IMPRESSION / MDM / ASSESSMENT AND PLAN / ED COURSE  I reviewed the triage vital signs and the nursing notes.                              Differential diagnosis includes, but is not limited to, abscess, cellulitis, rectal abscess, fournier's gangrene   Patient presents to the ED for evaluation of several days of progressive right  buttocks pain and swelling, concerning for an abscess and cellulitis.  Patient is evaluated for his complaints in ED, and is afebrile on presentation without signs of sepsis or toxicity.  Initial evaluation with labs reveals an elevated white blood cell count at 17.5, but otherwise normal electrolytes and lactic acid.  Patient CT revealed a large subcutaneous abscess in the right buttocks with some extension towards the anal wall.  I concur with these images based on my review independent review and the radiologist report.  Patient subsequently agreed to a local I&D procedure which was performed.  Copious amounts of purulent drainage were expressed from the right buttocks and patient was discharged with wound care instructions and supplies. Patient will be discharged home with prescriptions for Percocet and Bactrim. Patient is to follow up with this ED for wound check and packing removal in 3 days, as needed or otherwise directed. Patient is given ED precautions to return to the ED for any worsening or new symptoms.  Work note is provided as requested.    FINAL CLINICAL IMPRESSION(S) / ED DIAGNOSES   Final diagnoses:  Abscess of buttock, right     Rx / DC Orders    ED Discharge Orders          Ordered    sulfamethoxazole-trimethoprim (BACTRIM DS) 800-160 MG tablet  2 times daily        12/06/21 1539    oxyCODONE-acetaminophen (PERCOCET) 5-325 MG tablet  Every 8 hours PRN        12/06/21 1539             Note:  This document was prepared using Dragon voice recognition software and may include unintentional dictation errors.    Melvenia Needles, PA-C 12/06/21 1557    Lucrezia Starch, MD 12/06/21 [phone]

## 2023-03-24 ENCOUNTER — Emergency Department
Admission: EM | Admit: 2023-03-24 | Discharge: 2023-03-24 | Disposition: A | Discharge status: Home / Self Care | Payer: Self-pay | Attending: Emergency Medicine | Admitting: Emergency Medicine

## 2023-03-24 ENCOUNTER — Other Ambulatory Visit: Payer: Self-pay

## 2023-03-24 ENCOUNTER — Encounter: Payer: Self-pay | Admitting: Emergency Medicine

## 2023-03-24 DIAGNOSIS — L0231 Cutaneous abscess of buttock: Secondary | ICD-10-CM | POA: Insufficient documentation

## 2023-03-24 MED ORDER — LIDOCAINE HCL (PF) 1 % IJ SOLN
5.0000 mL | Freq: Once | INTRAMUSCULAR | Status: AC
  Administered 2023-03-24: 5 mL
  Filled 2023-03-24: qty 5

## 2023-03-24 MED ORDER — HYDROCODONE-ACETAMINOPHEN 5-325 MG PO TABS
1.0000 | ORAL_TABLET | ORAL | Status: AC
  Administered 2023-03-24: 1 via ORAL
  Filled 2023-03-24: qty 1

## 2023-03-24 MED ORDER — HYDROCODONE-ACETAMINOPHEN 5-325 MG PO TABS: 1.0000 | ORAL_TABLET | Freq: Four times a day (QID) | ORAL | 0 refills | Status: DC | PRN

## 2023-03-24 MED ORDER — SULFAMETHOXAZOLE-TRIMETHOPRIM 800-160 MG PO TABS: 1.0000 | ORAL_TABLET | Freq: Two times a day (BID) | ORAL | 0 refills | Status: DC

## 2023-03-24 MED ORDER — SULFAMETHOXAZOLE-TRIMETHOPRIM 800-160 MG PO TABS
1.0000 | ORAL_TABLET | Freq: Once | ORAL | Status: AC
  Administered 2023-03-24: 1 via ORAL
  Filled 2023-03-24: qty 1

## 2023-03-24 NOTE — ED Triage Notes (Signed)
Abscess on buttock was draining some last week but nothing is coming out now. Took Excedrin for pain around 3:30pm today and knocked the edge off. Hx of boil in same area that has to be drained in the past.

## 2023-03-24 NOTE — Discharge Instructions (Signed)
You may shower and get abscess site wet but do not submerge underwater.  Be careful removing bandage that you do not pull the packing out.  Packing needs to be removed in 2 to 3 days by PCP, walk-in clinic, urgent care or ER.  If any increasing pain swelling warmth redness or fevers return to the ER immediately

## 2023-03-24 NOTE — ED Provider Notes (Signed)
[facility] AT [facility] Provider Note   CSN: [id number] Arrival date & time: 03/24/23  1705     History  Chief Complaint  Patient presents with   Abscess    Chad Perez is a 37 y.o. male.  Presents to the emergency department evaluation of abscess to the right buttocks.  Abscess of the right buttocks has been present for several days.  He has not had any drainage.  He describes a lot of pressure and discomfort with sitting.  No fevers.  HPI     Home Medications Prior to Admission medications   Medication Sig Start Date End Date Taking? Authorizing Provider  HYDROcodone-acetaminophen (NORCO) 5-325 MG tablet Take 1 tablet by mouth every 6 (six) hours as needed for moderate pain. 03/24/23  Yes Evon Slack, PA-C  sulfamethoxazole-trimethoprim (BACTRIM DS) 800-160 MG tablet Take 1 tablet by mouth 2 (two) times daily. 03/24/23  Yes Evon Slack, PA-C      Allergies    Patient has no known allergies.    Review of Systems   Review of Systems  Physical Exam Updated Vital Signs BP 118/84 (BP Location: Left Arm)   Pulse 86   Temp 98.5 F (36.9 C) (Oral)   Resp 18   Wt 72.6 kg   SpO2 100%   BMI 20.01 kg/m  Physical Exam Constitutional:      Appearance: He is well-developed.  HENT:     Head: Normocephalic and atraumatic.  Eyes:     Conjunctiva/sclera: Conjunctivae normal.  Cardiovascular:     Rate and Rhythm: Normal rate.  Pulmonary:     Effort: Pulmonary effort is normal. No respiratory distress.  Musculoskeletal:        General: Normal range of motion.     Cervical back: Normal range of motion.  Skin:    Findings: No rash.     Comments: 3 cm boil fluctuant along the right gluteal region no sign of any PERI anal abscess.  Area of fluctuance along the right gluteal region well-maintained with no tunneling or tracking.  Neurological:     Mental Status: He is alert and oriented to person, place, and time.  Psychiatric:         Behavior: Behavior normal.        Thought Content: Thought content normal.     ED Results / Procedures / Treatments   Labs (all labs ordered are listed, but only abnormal results are displayed) Labs Reviewed - No data to display  EKG None  Radiology No results found.  Procedures .Marland KitchenIncision and Drainage  Date/Time: 03/24/2023 8:35 PM  Performed by: Evon Slack, PA-C Authorized by: Evon Slack, PA-C   Consent:    Consent obtained:  Verbal   Consent given by:  Patient   Risks discussed:  Bleeding and incomplete drainage Universal protocol:    Patient identity confirmed:  Verbally with patient Location:    Type:  Abscess   Size:  3   Location: Right gluteal. Pre-procedure details:    Skin preparation:  Povidone-iodine Anesthesia:    Anesthesia method:  Local infiltration   Local anesthetic:  Lidocaine 1% w/o epi Procedure type:    Complexity:  Simple Procedure details:    Ultrasound guidance: no     Incision types:  Stab incision   Incision depth:  Dermal   Drainage:  Purulent   Drainage amount:  Moderate   Wound treatment:  Drain placed   Packing materials:  1/4 in  iodoform gauze   Amount 1/4" iodoform:  2 inches Post-procedure details:    Procedure completion:  Tolerated     Medications Ordered in ED Medications  lidocaine (PF) (XYLOCAINE) 1 % injection 5 mL (5 mLs Infiltration Given 03/24/23 1920)  HYDROcodone-acetaminophen (NORCO/VICODIN) 5-325 MG per tablet 1 tablet (1 tablet Oral Given 03/24/23 1920)  sulfamethoxazole-trimethoprim (BACTRIM DS) 800-160 MG per tablet 1 tablet (1 tablet Oral Given 03/24/23 1920)    ED Course/ Medical Decision Making/ A&P                             Medical Decision Making Risk Prescription drug management.   37 year old male with right gluteal abscess, well-maintained, fluctuant with no drainage.  Incision and drainage of procedure was performed after lidocaine injected.  Patient tolerated the procedure well.   Moderate copious amounts of purulent material was expressed and iodoform packing was placed into the wound.  He is placed on Bactrim.  His vital signs are stable he is afebrile.  Patient with significant improvement in overall pain and pressure immediately following incision and drainage procedure Final Clinical Impression(s) / ED Diagnoses Final diagnoses:  Left buttock abscess    Rx / DC Orders ED Discharge Orders          Ordered    sulfamethoxazole-trimethoprim (BACTRIM DS) 800-160 MG tablet  2 times daily        03/24/23 2033    HYDROcodone-acetaminophen (NORCO) 5-325 MG tablet  Every 6 hours PRN        03/24/23 2033              Ronnette Juniper 03/24/23 2037    Georga Hacking, MD 03/25/23 [phone]

## 2023-03-29 ENCOUNTER — Other Ambulatory Visit: Payer: Self-pay

## 2023-03-29 ENCOUNTER — Emergency Department
Admission: EM | Admit: 2023-03-29 | Discharge: 2023-03-29 | Disposition: A | Discharge status: Home / Self Care | Payer: Medicaid Other | Attending: Emergency Medicine | Admitting: Emergency Medicine

## 2023-03-29 DIAGNOSIS — Z4800 Encounter for change or removal of nonsurgical wound dressing: Secondary | ICD-10-CM

## 2023-03-29 DIAGNOSIS — Z4801 Encounter for change or removal of surgical wound dressing: Secondary | ICD-10-CM | POA: Insufficient documentation

## 2023-03-29 DIAGNOSIS — L0231 Cutaneous abscess of buttock: Secondary | ICD-10-CM | POA: Insufficient documentation

## 2023-03-29 NOTE — Discharge Instructions (Signed)
Please keep your wound clean by washing at least daily with soap and water. If you see any signs of infection like spreading redness, pus coming from the wound, extreme pain, fevers, chills or any other worsening doctor right away or come back to the emergency department  Take acetaminophen 650 mg and ibuprofen 400 mg every 6 hours for pain.  Take with food.  

## 2023-03-29 NOTE — ED Provider Notes (Signed)
   [facility] Provider Note    Event Date/Time   First MD Initiated Contact with Patient 03/29/23 2226     (approximate)   History   packing removal   HPI  Saxon Barich Saggese is a 37 y.o. male   Past medical history of abscess I&D several days ago here for packing removal.  Pain much improved after the drainage.  No systemic symptoms of fevers or chills.  Has been taking antibiotics as prescribed.    External Medical Documents Reviewed: Emergency department visit earlier this week for I&D      Physical Exam   Triage Vital Signs: ED Triage Vitals  Enc Vitals Group     BP 03/29/23 2155 (!) 122/98     Pulse Rate 03/29/23 2155 81     Resp 03/29/23 2155 16     Temp 03/29/23 2155 98.2 F (36.8 C)     Temp Source 03/29/23 2155 Oral     SpO2 03/29/23 2155 99 %     Weight 03/29/23 2154 160 lb 15 oz (73 kg)     Height 03/29/23 2154 6\' 3"  (1.905 m)     Head Circumference --      Peak Flow --      Pain Score 03/29/23 2154 0     Pain Loc --      Pain Edu? --      Excl. in GC? --     Most recent vital signs: Vitals:   03/29/23 2155  BP: (!) 122/98  Pulse: 81  Resp: 16  Temp: 98.2 F (36.8 C)  SpO2: 99%    General: Awake, no distress.  CV:  Good peripheral perfusion.  Resp:  Normal effort.  Abd:  No distention.  Other:  Packing in place in the buttock, no cellulitic changes overlying   ED Results / Procedures / Treatments   Labs (all labs ordered are listed, but only abnormal results are displayed) Labs Reviewed - No data to display  PROCEDURES:  Critical Care performed: no  Procedures  MEDICATIONS ORDERED IN ED: Medications - No data to display   IMPRESSION / MDM / ASSESSMENT AND PLAN / ED COURSE  I reviewed the triage vital signs and the nursing notes.                                Patient's presentation is most consistent with acute presentation with potential threat to life or bodily function.  Differential  diagnosis includes, but is not limited to, cellulitis, abscess, packing removal   MDM: Packing removed patient tolerated, scant purulent drainage from the site no cellulitic changes hemodynamics appropriate reassuring healing well.  Discharge.    FINAL CLINICAL IMPRESSION(S) / ED DIAGNOSES   Final diagnoses:  Abscess packing removal  Abscess of buttock     Rx / DC Orders   ED Discharge Orders     None        Note:  This document was prepared using Dragon voice recognition software and may include unintentional dictation errors.    Pilar Jarvis, MD 03/29/23 [phone]

## 2023-03-29 NOTE — ED Triage Notes (Signed)
Pt to ed from home via POV "to have my packing removed from my boil that was cut the other day". Pt is caoX4, in no acute distress and ambulatory in triage.

## 2024-08-16 ENCOUNTER — Other Ambulatory Visit: Payer: Self-pay

## 2024-08-16 ENCOUNTER — Emergency Department: Payer: Self-pay

## 2024-08-16 ENCOUNTER — Emergency Department
Admission: EM | Admit: 2024-08-16 | Discharge: 2024-08-16 | Disposition: A | Discharge status: Home / Self Care | Payer: Self-pay | Attending: Emergency Medicine | Admitting: Emergency Medicine

## 2024-08-16 DIAGNOSIS — L03314 Cellulitis of groin: Secondary | ICD-10-CM | POA: Insufficient documentation

## 2024-08-16 DIAGNOSIS — N492 Inflammatory disorders of scrotum: Secondary | ICD-10-CM | POA: Insufficient documentation

## 2024-08-16 LAB — COMPREHENSIVE METABOLIC PANEL WITH GFR
ALT: 12 U/L (ref 0–44)
AST: 16 U/L (ref 15–41)
Albumin: 3.5 g/dL (ref 3.5–5.0)
Alkaline Phosphatase: 65 U/L (ref 38–126)
Anion gap: 9 (ref 5–15)
BUN: 9 mg/dL (ref 6–20)
CO2: 27 mmol/L (ref 22–32)
Calcium: 8.6 mg/dL — ABNORMAL LOW (ref 8.9–10.3)
Chloride: 104 mmol/L (ref 98–111)
Creatinine, Ser: 0.69 mg/dL (ref 0.61–1.24)
GFR, Estimated: >60 mL/min (ref >60)
Glucose, Bld: 106 mg/dL — ABNORMAL HIGH (ref 70–99)
Potassium: 3.6 mmol/L (ref 3.5–5.1)
Sodium: 140 mmol/L (ref 135–145)
Total Bilirubin: 0.5 mg/dL (ref 0.0–1.2)
Total Protein: 7.1 g/dL (ref 6.5–8.1)

## 2024-08-16 LAB — CBC WITH DIFFERENTIAL/PLATELET
Abs Immature Granulocytes: 0.03 K/uL (ref 0.00–0.07)
Basophils Absolute: 0.1 K/uL (ref 0.0–0.1)
Basophils Relative: 1 %
Eosinophils Absolute: 0.3 K/uL (ref 0.0–0.5)
Eosinophils Relative: 3 %
HCT: 39.6 % (ref 39.0–52.0)
Hemoglobin: 13.7 g/dL (ref 13.0–17.0)
Immature Granulocytes: 0 %
Lymphocytes Relative: 18 %
Lymphs Abs: 2.3 K/uL (ref 0.7–4.0)
MCH: 33.3 pg (ref 26.0–34.0)
MCHC: 34.6 g/dL (ref 30.0–36.0)
MCV: 96.1 fL (ref 80.0–100.0)
Monocytes Absolute: 1.1 K/uL — ABNORMAL HIGH (ref 0.1–1.0)
Monocytes Relative: 9 %
Neutro Abs: 8.9 K/uL — ABNORMAL HIGH (ref 1.7–7.7)
Neutrophils Relative %: 69 %
Platelets: 352 K/uL (ref 150–400)
RBC: 4.12 MIL/uL — ABNORMAL LOW (ref 4.22–5.81)
RDW: 14.3 % (ref 11.5–15.5)
WBC: 12.8 K/uL — ABNORMAL HIGH (ref 4.0–10.5)
nRBC: 0 % (ref 0.0–0.2)

## 2024-08-16 MED ORDER — MORPHINE SULFATE (PF) 2 MG/ML IV SOLN
2.0000 mg | Freq: Once | INTRAVENOUS | Status: AC
  Administered 2024-08-16: 2 mg via INTRAVENOUS
  Filled 2024-08-16: qty 1

## 2024-08-16 MED ORDER — LIDOCAINE HCL (PF) 1 % IJ SOLN
5.0000 mL | Freq: Once | INTRAMUSCULAR | Status: AC
  Administered 2024-08-16: 5 mL
  Filled 2024-08-16: qty 5

## 2024-08-16 MED ORDER — SODIUM CHLORIDE 0.9 % IV SOLN
1.0000 g | Freq: Once | INTRAVENOUS | Status: AC
  Administered 2024-08-16: 1 g via INTRAVENOUS
  Filled 2024-08-16: qty 10

## 2024-08-16 MED ORDER — IOHEXOL 300 MG/ML  SOLN
100.0000 mL | Freq: Once | INTRAMUSCULAR | Status: AC | PRN
  Administered 2024-08-16: 100 mL via INTRAVENOUS

## 2024-08-16 MED ORDER — SULFAMETHOXAZOLE-TRIMETHOPRIM 800-160 MG PO TABS: 1.0000 | ORAL_TABLET | Freq: Two times a day (BID) | ORAL | 0 refills | Status: AC

## 2024-08-16 MED ORDER — ACETAMINOPHEN 500 MG PO TABS
1000.0000 mg | ORAL_TABLET | Freq: Once | ORAL | Status: AC
  Administered 2024-08-16: 1000 mg via ORAL
  Filled 2024-08-16: qty 2

## 2024-08-16 MED ORDER — SULFAMETHOXAZOLE-TRIMETHOPRIM 800-160 MG PO TABS
1.0000 | ORAL_TABLET | Freq: Once | ORAL | Status: AC
  Administered 2024-08-16: 1 via ORAL
  Filled 2024-08-16: qty 1

## 2024-08-16 MED ORDER — OXYCODONE-ACETAMINOPHEN 5-325 MG PO TABS: 1.0000 | ORAL_TABLET | Freq: Three times a day (TID) | ORAL | 0 refills | Status: AC | PRN

## 2024-08-16 MED ORDER — SODIUM CHLORIDE 0.9 % IV BOLUS
1000.0000 mL | Freq: Once | INTRAVENOUS | Status: AC
  Administered 2024-08-16: 1000 mL via INTRAVENOUS

## 2024-08-16 MED ORDER — IBUPROFEN 600 MG PO TABS
600.0000 mg | ORAL_TABLET | Freq: Once | ORAL | Status: AC
  Administered 2024-08-16: 600 mg via ORAL
  Filled 2024-08-16: qty 1

## 2024-08-16 NOTE — Discharge Instructions (Addendum)
 You have been seen in the Emergency Department (ED) today for an abscess.  Please follow up with the urologist listed (Dr. Francisca) in one week. Pick up and take your antibiotics as prescribed.  I also sent in a prescription for some pain medicine.  Please do not work, drive, make any legal binding decisions, climb on ladders or heights while taking this medication.    If anything worsens and the antibiotics are not helping with your pain or decrease in the size of the abscess within 24 hours, please return to the emergency department immediately.  Please follow up with your doctor or in the ED in 24-48 hours for recheck of your wound.  Read through the additional discharge instructions included below regarding wound care recommendations.  Keep the wound clean and dry, though you may wash as you would normally.  Change the dressing twice daily.  Call your doctor sooner or return to the ED if you develop worsening signs of infection such as: increased redness, increased pain, pus, or fever.   Abscess An abscess is an infected area that contains a collection of pus and debris. It can occur in almost any part of the body. An abscess is also known as a furuncle or boil. CAUSES  An abscess occurs when tissue gets infected. This can occur from blockage of oil or sweat glands, infection of hair follicles, or a minor injury to the skin. As the body tries to fight the infection, pus collects in the area and creates pressure under the skin. This pressure causes pain. People with weakened immune systems have difficulty fighting infections and get certain abscesses more often.  SYMPTOMS Usually an abscess develops on the skin and becomes a painful mass that is red, warm, and tender. If the abscess forms under the skin, you may feel a moveable soft area under the skin. Some abscesses break open (rupture) on their own, but most will continue to get worse without care. The infection can spread deeper into the body  and eventually into the bloodstream, causing you to feel ill.  DIAGNOSIS  Your caregiver will take your medical history and perform a physical exam. A sample of fluid may also be taken from the abscess to determine what is causing your infection. TREATMENT  Your caregiver may prescribe antibiotic medicines to fight the infection. However, taking antibiotics alone usually does not cure an abscess. Your caregiver may need to make a small cut (incision) in the abscess to drain the pus. In some cases, gauze is packed into the abscess to reduce pain and to continue draining the area. HOME CARE INSTRUCTIONS  Only take over-the-counter or prescription medicines for pain, discomfort, or fever as directed by your caregiver. If you were prescribed antibiotics, take them as directed. Finish them even if you start to feel better. If gauze is used, follow your caregiver's directions for changing the gauze. To avoid spreading the infection: Keep your draining abscess covered with a bandage. Wash your hands well. Do not share personal care items, towels, or whirlpools with others. Avoid skin contact with others. Keep your skin and clothes clean around the abscess. Keep all follow-up appointments as directed by your caregiver. SEEK MEDICAL CARE IF:  You have increased pain, swelling, redness, fluid drainage, or bleeding. You have muscle aches, chills, or a general ill feeling. You have a fever. MAKE SURE YOU:  Understand these instructions. Will watch your condition. Will get help right away if you are not doing well or get worse.  Document Released: 07/15/2005 Document Revised: 04/05/2012 Document Reviewed: 12/18/2011 [facility] Patient Information 2015 [place], [place]. This information is not intended to replace advice given to you by your health care provider. Make sure you discuss any questions you have with your health care provider.  Abscess Care After An abscess (also called a boil or furuncle) is  an infected area that contains a collection of pus. Signs and symptoms of an abscess include pain, tenderness, redness, or hardness, or you may feel a moveable soft area under your skin. An abscess can occur anywhere in the body. The infection may spread to surrounding tissues causing cellulitis. A cut (incision) by the surgeon was made over your abscess and the pus was drained out. Gauze may have been packed into the space to provide a drain that will allow the cavity to heal from the inside outwards. The boil may be painful for 5 to 7 days. Most people with a boil do not have high fevers. Your abscess, if seen early, may not have localized, and may not have been lanced. If not, another appointment may be required for this if it does not get better on its own or with medications. HOME CARE INSTRUCTIONS  Only take over-the-counter or prescription medicines for pain, discomfort, or fever as directed by your caregiver. When you bathe, soak and then remove gauze or iodoform packs at least daily or as directed by your caregiver. You may then wash the wound gently with mild soapy water. Repack with gauze or do as your caregiver directs. SEEK IMMEDIATE MEDICAL CARE IF:  You develop increased pain, swelling, redness, drainage, or bleeding in the wound site. You develop signs of generalized infection including muscle aches, chills, fever, or a general ill feeling. An oral temperature above 102 F (38.9 C) develops, not controlled by medication. See your caregiver for a recheck if you develop any of the symptoms described above. If medications (antibiotics) were prescribed, take them as directed. Document Released: 04/23/2005 Document Revised: 12/28/2011 Document Reviewed: 12/19/2007 [facility] Patient Information 2015 [place], [place]. This information is not intended to replace advice given to you by your health care provider. Make sure you discuss any questions you have with your health care  provider.  Cellulitis Cellulitis is an infection of the skin and the tissue beneath it. The infected area is usually red and tender. Cellulitis occurs most often in the arms and lower legs.  CAUSES  Cellulitis is caused by bacteria that enter the skin through cracks or cuts in the skin. The most common types of bacteria that cause cellulitis are staphylococci and streptococci. SIGNS AND SYMPTOMS  Redness and warmth. Swelling. Tenderness or pain. Fever. DIAGNOSIS  Your health care provider can usually determine what is wrong based on a physical exam. Blood tests may also be done. TREATMENT  Treatment usually involves taking an antibiotic medicine. HOME CARE INSTRUCTIONS  Take your antibiotic medicine as directed by your health care provider. Finish the antibiotic even if you start to feel better. Keep the infected arm or leg elevated to reduce swelling. Apply a warm cloth to the affected area up to 4 times per day to relieve pain. Take medicines only as directed by your health care provider. Keep all follow-up visits as directed by your health care provider. SEEK MEDICAL CARE IF:  You notice red streaks coming from the infected area. Your red area gets larger or turns dark in color. Your bone or joint underneath the infected area becomes painful after the skin has healed.  Your infection returns in the same area or another area. You notice a swollen bump in the infected area. You develop new symptoms. You have a fever. SEEK IMMEDIATE MEDICAL CARE IF:  You feel very sleepy. You develop vomiting or diarrhea. You have a general ill feeling (malaise) with muscle aches and pains. MAKE SURE YOU:  Understand these instructions. Will watch your condition. Will get help right away if you are not doing well or get worse. Document Released: 07/15/2005 Document Revised: 02/19/2014 Document Reviewed: 12/21/2011 [facility] Patient Information 2015 [place], [place]. This information is not intended  to replace advice given to you by your health care provider. Make sure you discuss any questions you have with your health care provider.

## 2024-08-16 NOTE — ED Notes (Signed)
 Pt verbalizes understanding of discharge instructions. Opportunity for questioning and answers were provided. Pt discharged from ED with mother.

## 2024-08-16 NOTE — ED Provider Notes (Signed)
 [facility] Provider Note    Event Date/Time   First MD Initiated Contact with Patient 08/16/24 1854     (approximate)   History   Abscess   HPI  Chad Perez is a 38 y.o. male  with no significant past medical history presents to the emergency department with concerns for abscess on the right side of his genital region/scrotum that he noticed 3 days ago on Sunday.  Patient states he tried to drain it himself but nothing came out of it.  Very painful and erythematous. He denies fevers, chills, abdominal pain, N/V, diarrhea, dysuria.  Reports he has had an abscess in this area before but it has been a long time.   Physical Exam   Triage Vital Signs: ED Triage Vitals  Encounter Vitals Group     BP 08/16/24 1846 (!) 134/98     Girls Systolic BP Percentile --      Girls Diastolic BP Percentile --      Boys Systolic BP Percentile --      Boys Diastolic BP Percentile --      Pulse Rate 08/16/24 1846 62     Resp 08/16/24 1846 18     Temp 08/16/24 1846 98.5 F (36.9 C)     Temp Source 08/16/24 1846 Oral     SpO2 08/16/24 1846 99 %     Weight 08/16/24 1844 160 lb (72.6 kg)     Height 08/16/24 1844 6' 3 (1.905 m)     Head Circumference --      Peak Flow --      Pain Score 08/16/24 1844 7     Pain Loc --      Pain Education --      Exclude from Growth Chart --     Most recent vital signs: Vitals:   08/16/24 1846 08/16/24 2331  BP: (!) 134/98 (!) 129/102  Pulse: 62 61  Resp: 18 18  Temp: 98.5 F (36.9 C) 98.7 F (37.1 C)  SpO2: 99% 100%    General: Awake, in no acute distress. Appears stated age. CV: Good peripheral perfusion. Regular rate, 62 bpm. Respiratory:Normal respiratory effort.  No respiratory distress.  GI: Soft, non-distended, non-tender.  Skin:Warm, dry, intact.  GU: See media image below. 3x2 cm fluctuance to the right scrotal region, no open wound.    ED Results / Procedures / Treatments   Labs (all labs ordered are  listed, but only abnormal results are displayed) Labs Reviewed  CBC WITH DIFFERENTIAL/PLATELET - Abnormal; Notable for the following components:      Result Value   WBC 12.8 (*)    RBC 4.12 (*)    Neutro Abs 8.9 (*)    Monocytes Absolute 1.1 (*)    All other components within normal limits  COMPREHENSIVE METABOLIC PANEL WITH GFR - Abnormal; Notable for the following components:   Glucose, Bld 106 (*)    Calcium 8.6 (*)    All other components within normal limits     EKG     RADIOLOGY CT pelvis with contrast ordered.  FINDINGS: Urinary Tract:  No abnormality visualized.   Bowel:  Unremarkable visualized pelvic bowel loops.   Vascular/Lymphatic: No pathologically enlarged lymph nodes. No significant vascular abnormality seen.   Reproductive: Rim enhancing gas and fluid collection in the right scrotum measuring 2.9 x 2.1 cm. No soft tissue emphysema otherwise noted. Stranding and edema within the scrotum and perineum.   Other:  Negative for ascites or  free air   Musculoskeletal: Are no acute osseous abnormality. Gluteal skin thickening. Focal area of soft tissue thickening within the left inferior gluteal fold, series 4, image 134 without rim enhancing collection. Generalized subcutaneous edema within the gluteal region and perineum.   IMPRESSION: 1. 2.9 cm rim enhancing gas and fluid collection in the right scrotum consistent with abscess. No soft tissue emphysema otherwise noted. 2. Generalized subcutaneous edema within the gluteal region and perineum, possible cellulitis. Focal area of skin and soft tissue thickening within the left inferior gluteal fold without rim enhancing collection. This could represent a phlegmon. Correlate with direct inspection.   PROCEDURES:  Critical Care performed: No   .Incision and Drainage  Date/Time: 08/16/2024 11:31 PM  Performed by: Sheron, Shakari Qazi, PA-C Authorized by: Sheron Salm, PA-C   Consent:    Consent  obtained:  Verbal   Consent given by:  Patient   Risks, benefits, and alternatives were discussed: yes     Risks discussed:  Bleeding, damage to other organs, infection, incomplete drainage and pain Universal protocol:    Procedure explained and questions answered to patient or proxy's satisfaction: yes     Immediately prior to procedure, a time out was called: yes     Patient identity confirmed:  Verbally with patient Location:    Type:  Abscess   Size:  3x2 cm   Location:  Anogenital   Anogenital location:  Scrotal wall Pre-procedure details:    Skin preparation:  Povidone-iodine Sedation:    Sedation type:  None Anesthesia:    Anesthesia method:  Local infiltration   Local anesthetic:  Lidocaine  1% w/o epi Procedure type:    Complexity:  Complex Procedure details:    Ultrasound guidance: no     Needle aspiration: no     Incision types:  Stab incision   Incision depth:  Subcutaneous   Drainage:  Bloody   Drainage amount:  Moderate   Wound treatment:  Wound left open   Packing materials:  None Post-procedure details:    Procedure completion:  Procedure terminated at patient's request     MEDICATIONS ORDERED IN ED: Medications  sodium chloride 0.9 % bolus 1,000 mL (0 mLs Intravenous Stopped 08/16/24 2200)  iohexol  (OMNIPAQUE ) 300 MG/ML solution 100 mL (100 mLs Intravenous Contrast Given 08/16/24 2105)  sulfamethoxazole -trimethoprim  (BACTRIM  DS) 800-160 MG per tablet 1 tablet (1 tablet Oral Given 08/16/24 2206)  ibuprofen  (ADVIL ) tablet 600 mg (600 mg Oral Given 08/16/24 2206)  acetaminophen  (TYLENOL ) tablet 1,000 mg (1,000 mg Oral Given 08/16/24 2205)  lidocaine  (PF) (XYLOCAINE ) 1 % injection 5 mL (5 mLs Infiltration Given by Other 08/16/24 2206)  morphine  (PF) 2 MG/ML injection 2 mg (2 mg Intravenous Given 08/16/24 2229)  cefTRIAXone (ROCEPHIN) 1 g in sodium chloride 0.9 % 100 mL IVPB (0 g Intravenous Stopped 08/16/24 2332)  morphine  (PF) 2 MG/ML injection 2 mg (2 mg  Intravenous Given 08/16/24 2302)     IMPRESSION / MDM / ASSESSMENT AND PLAN / ED COURSE  I reviewed the triage vital signs and the nursing notes.                              Differential diagnosis includes, but is not limited to, scrotal abscess, cellulitis, cyst, Fournier's Gangrene  Patient's presentation is most consistent with acute complicated illness / injury requiring diagnostic workup.   Patient is a 38 year old male with right scrotal fluctuance on exam, consistent with abscess.  Had  some concern that this reach deeper tissue so labs and imaging was ordered. Clinical Course as of 08/16/24 2348  Wed Aug 16, 2024  2016 Late entry. CBC, CMP, fluid bolus and CT pelvis w/ contrast ordered. [SD]  2121 Blood cell count elevated at 12.8, Neutrophils 8.9.  CMP reassuring. [SD]    Clinical Course User Index [SD] Sheron, Brenten Janney, PA-C   CT pelvis with abscess of the scrotum.  Consulted Dr. Francisca, who recommended starting oral Bactrim  and I&D in the emergency department.  Please see procedure note for full details regarding I&D above.  I&D was terminated early due to patient's intolerable pain after administration of fluids, tylenol , ibuprofen , and IV morphine . I was able to partially anesthetize his scrotum with 2 mL of lidocaine  before he asked me to stop. Patient stated he did not want the needle to be reinserted for anesthesia purposes, as it was causing too much pain.  He tolerated a small stab incision afterwards which produced some bloody drainage with scant purulence, but the procedure had to be terminated early at the patient's request due to his pain.  Sterile wound dressing was applied.  Discussed wound care instructions with him.   I discussed with him that due to inadequate drainage during the I&D, he may be at risk for Fournier's Gangrene or losing a testicle and to return immediately to the ER for any signs of worsening infection or pain.  He understood and agreed to these  instructions. Gave him a dose of bactrim  here and a round of IV Rocephin, a second dose of morphine  as well prior to discharge.  Sent prescriptions for Bactrim  and Percocet.  PDMP reviewed prior to prescribing Percocet.  Discussed precautions regarding taking Percocet.  I would like him to follow-up in the emergency department, with his doctor or urgent care in 24 to 48 hours for a wound recheck.Work note provided. He will also follow-up with Dr. Francisca in 1 week outpatient per Dr. Marval request.  The patient may return to the emergency department for any new, worsening, or concerning symptoms. Patient was given the opportunity to ask questions; all questions were answered. Emergency department return precautions were discussed with the patient.  Patient is in agreement to the treatment plan.  Patient is stable for discharge.   FINAL CLINICAL IMPRESSION(S) / ED DIAGNOSES   Final diagnoses:  Scrotal abscess  Cellulitis of groin     Rx / DC Orders   ED Discharge Orders          Ordered    sulfamethoxazole -trimethoprim  (BACTRIM  DS) 800-160 MG tablet  2 times daily        08/16/24 2311    oxyCODONE -acetaminophen  (PERCOCET) 5-325 MG tablet  Every 8 hours PRN        08/16/24 2311             Note:  This document was prepared using Dragon voice recognition software and may include unintentional dictation errors.     Sheron Salm, PA-C 08/16/24 2348    Levander Slate, MD 08/19/24 [phone]

## 2024-08-16 NOTE — ED Triage Notes (Signed)
 Pt via POV from home. Pt c/o abscess on the R side of his scrotum he noticed on Sunday. States he tried to drain it himself and nothing came out. Denies any fevers. Pt is A&Ox4 and NAD, ambulatory to triage with steady gait.

## 2024-08-22 NOTE — Progress Notes (Deleted)
 08/24/2024 2:05 PM   Chad Perez 1985-12-14 [id number]  Referring provider: No referring provider defined for this encounter.  Urological history: 1.   No chief complaint on file.  HPI: Chad Perez is a 38 y.o. man who presents today for scrotal abscess.   Previous records reviewed.  He presented to the ED on August 16, 2024 with a scrotal abscess.  Unfortunately, he did not allow adequate I&D at bedside.  He was discharged on antibiotics and pain medicine instructed to follow-up with us  in 1 week.  Bactrim  DS was given.    Serum creatinine 0.69, eGFR greater than 60  WBC count 12.8    PMH: No past medical history on file.  Surgical History: No past surgical history on file.  Home Medications:  Allergies as of 08/24/2024   No Known Allergies      Medication List    as of August 22, 2024  2:05 PM   You have not been prescribed any medications.     Allergies: No Known Allergies  Family History: No family history on file.  Social History:  reports that he has been smoking. He has never used smokeless tobacco. He reports current alcohol use. He reports current drug use. Drug: Marijuana.  ROS: Pertinent ROS in HPI  Physical Exam: There were no vitals taken for this visit.  Constitutional:  Well nourished. Alert and oriented, No acute distress. HEENT: [place] AT, moist mucus membranes.  Trachea midline, no masses. Cardiovascular: No clubbing, cyanosis, or edema. Respiratory: Normal respiratory effort, no increased work of breathing. GI: Abdomen is soft, non tender, non distended, no abdominal masses. Liver and spleen not palpable.  No hernias appreciated.  Stool sample for occult testing is not indicated.   GU: No CVA tenderness.  No bladder fullness or masses.  Patient with circumcised/uncircumcised phallus. ***Foreskin easily retracted***  Urethral meatus is patent.  No penile discharge. No penile lesions or rashes. Scrotum without lesions,  cysts, rashes and/or edema.  Testicles are located scrotally bilaterally. No masses are appreciated in the testicles. Left and right epididymis are normal. Rectal: Patient with  normal sphincter tone. Anus and perineum without scarring or rashes. No rectal masses are appreciated. Prostate is approximately *** grams, *** nodules are appreciated. Seminal vesicles are normal. Skin: No rashes, bruises or suspicious lesions. Lymph: No cervical or inguinal adenopathy. Neurologic: Grossly intact, no focal deficits, moving all 4 extremities. Psychiatric: Normal mood and affect.  Laboratory Data: See Epic and HPI   I have reviewed the labs.   Pertinent Imaging: CLINICAL DATA:  Scrotal abscess   EXAM: CT PELVIS WITH CONTRAST   TECHNIQUE: Multidetector CT imaging of the pelvis was performed using the standard protocol following the bolus administration of intravenous contrast.   RADIATION DOSE REDUCTION: This exam was performed according to the departmental dose-optimization program which includes automated exposure control, adjustment of the mA and/or kV according to patient size and/or use of iterative reconstruction technique.   CONTRAST:  OMNIPAQUE  IOHEXOL  300 MG/ML  SOLN   COMPARISON:  CT 12/06/2021   FINDINGS: Urinary Tract:  No abnormality visualized.   Bowel:  Unremarkable visualized pelvic bowel loops.   Vascular/Lymphatic: No pathologically enlarged lymph nodes. No significant vascular abnormality seen.   Reproductive: Rim enhancing gas and fluid collection in the right scrotum measuring 2.9 x 2.1 cm. No soft tissue emphysema otherwise noted. Stranding and edema within the scrotum and perineum.   Other:  Negative for ascites or free air  Musculoskeletal: Are no acute osseous abnormality. Gluteal skin thickening. Focal area of soft tissue thickening within the left inferior gluteal fold, series 4, image 134 without rim enhancing collection. Generalized  subcutaneous edema within the gluteal region and perineum.   IMPRESSION: 1. 2.9 cm rim enhancing gas and fluid collection in the right scrotum consistent with abscess. No soft tissue emphysema otherwise noted. 2. Generalized subcutaneous edema within the gluteal region and perineum, possible cellulitis. Focal area of skin and soft tissue thickening within the left inferior gluteal fold without rim enhancing collection. This could represent a phlegmon. Correlate with direct inspection.     Electronically Signed   By: Luke Bun M.D.   On: 08/16/2024 21:21  Assessment & Plan:  ***  1. Scrotal abscess  No follow-ups on file.  These notes generated with voice recognition software. I apologize for typographical errors.  CLOTILDA HELON RIGGERS  [facility] [address]  [address] [place], [place] [address] [phone]

## 2024-08-24 ENCOUNTER — Ambulatory Visit: Payer: Self-pay | Admitting: Urology

## 2024-10-01 ENCOUNTER — Other Ambulatory Visit: Payer: Self-pay

## 2024-10-01 ENCOUNTER — Emergency Department: Payer: Self-pay | Admitting: Certified Registered"

## 2024-10-01 ENCOUNTER — Inpatient Hospital Stay
Admission: EM | Admit: 2024-10-01 | Discharge: 2024-10-03 | DRG: 144 | Disposition: A | Discharge status: Home / Self Care | Payer: Self-pay | Attending: Internal Medicine | Admitting: Internal Medicine

## 2024-10-01 ENCOUNTER — Encounter: Admission: EM | Disposition: A | Payer: Self-pay | Source: Home / Self Care | Attending: Otolaryngology

## 2024-10-01 ENCOUNTER — Emergency Department: Payer: Self-pay

## 2024-10-01 DIAGNOSIS — F17200 Nicotine dependence, unspecified, uncomplicated: Secondary | ICD-10-CM | POA: Diagnosis present

## 2024-10-01 DIAGNOSIS — R5381 Other malaise: Secondary | ICD-10-CM | POA: Diagnosis present

## 2024-10-01 DIAGNOSIS — K122 Cellulitis and abscess of mouth: Secondary | ICD-10-CM

## 2024-10-01 DIAGNOSIS — K047 Periapical abscess without sinus: Principal | ICD-10-CM | POA: Diagnosis present

## 2024-10-01 DIAGNOSIS — L03221 Cellulitis of neck: Secondary | ICD-10-CM | POA: Diagnosis present

## 2024-10-01 DIAGNOSIS — R1312 Dysphagia, oropharyngeal phase: Secondary | ICD-10-CM | POA: Diagnosis present

## 2024-10-01 DIAGNOSIS — D72829 Elevated white blood cell count, unspecified: Secondary | ICD-10-CM | POA: Diagnosis present

## 2024-10-01 LAB — BASIC METABOLIC PANEL WITH GFR
Anion gap: 11 (ref 5–15)
BUN: 14 mg/dL (ref 6–20)
CO2: 26 mmol/L (ref 22–32)
Calcium: 9.7 mg/dL (ref 8.9–10.3)
Chloride: 104 mmol/L (ref 98–111)
Creatinine, Ser: 0.63 mg/dL (ref 0.61–1.24)
GFR, Estimated: >60 mL/min (ref >60)
Glucose, Bld: 114 mg/dL — ABNORMAL HIGH (ref 70–99)
Potassium: 3.9 mmol/L (ref 3.5–5.1)
Sodium: 141 mmol/L (ref 135–145)

## 2024-10-01 LAB — CBC WITH DIFFERENTIAL/PLATELET
Abs Immature Granulocytes: 0.19 K/uL — ABNORMAL HIGH (ref 0.00–0.07)
Basophils Absolute: 0.1 K/uL (ref 0.0–0.1)
Basophils Relative: 0 %
Eosinophils Absolute: 0 K/uL (ref 0.0–0.5)
Eosinophils Relative: 0 %
HCT: 42.7 % (ref 39.0–52.0)
Hemoglobin: 14.9 g/dL (ref 13.0–17.0)
Immature Granulocytes: 1 %
Lymphocytes Relative: 8 %
Lymphs Abs: 1.7 K/uL (ref 0.7–4.0)
MCH: 33.2 pg (ref 26.0–34.0)
MCHC: 34.9 g/dL (ref 30.0–36.0)
MCV: 95.1 fL (ref 80.0–100.0)
Monocytes Absolute: 2.5 K/uL — ABNORMAL HIGH (ref 0.1–1.0)
Monocytes Relative: 11 %
Neutro Abs: 17.8 K/uL — ABNORMAL HIGH (ref 1.7–7.7)
Neutrophils Relative %: 80 %
Platelets: 413 K/uL — ABNORMAL HIGH (ref 150–400)
RBC: 4.49 MIL/uL (ref 4.22–5.81)
RDW: 14 % (ref 11.5–15.5)
WBC: 22.3 K/uL — ABNORMAL HIGH (ref 4.0–10.5)
nRBC: 0 % (ref 0.0–0.2)

## 2024-10-01 SURGERY — INCISION AND DRAINAGE, ABSCESS, PERITONSILLAR
Anesthesia: General | Laterality: Right

## 2024-10-01 MED ORDER — LACTATED RINGERS IV SOLN: INTRAVENOUS | Status: DC | PRN

## 2024-10-01 MED ORDER — OXYCODONE HCL 5 MG PO TABS: 5.0000 mg | ORAL_TABLET | Freq: Once | ORAL | Status: DC | PRN

## 2024-10-01 MED ORDER — ROCURONIUM BROMIDE 100 MG/10ML IV SOLN
INTRAVENOUS | Status: DC | PRN
  Administered 2024-10-01: 70 mg via INTRAVENOUS

## 2024-10-01 MED ORDER — FENTANYL CITRATE (PF) 100 MCG/2ML IJ SOLN
INTRAMUSCULAR | Status: DC | PRN
  Administered 2024-10-01 (×2): 50 ug via INTRAVENOUS

## 2024-10-01 MED ORDER — ACETAMINOPHEN 650 MG RE SUPP: 650.0000 mg | Freq: Four times a day (QID) | RECTAL | Status: DC | PRN

## 2024-10-01 MED ORDER — OXYCODONE HCL 5 MG/5ML PO SOLN: 5.0000 mg | Freq: Once | ORAL | Status: DC | PRN

## 2024-10-01 MED ORDER — SODIUM CHLORIDE 0.9 % IV SOLN
2.0000 g | Freq: Once | INTRAVENOUS | Status: AC
  Administered 2024-10-01: 2 g via INTRAVENOUS
  Filled 2024-10-01: qty 20

## 2024-10-01 MED ORDER — FENTANYL CITRATE (PF) 100 MCG/2ML IJ SOLN
INTRAMUSCULAR | Status: AC
  Filled 2024-10-01: qty 2

## 2024-10-01 MED ORDER — PROPOFOL 10 MG/ML IV BOLUS
INTRAVENOUS | Status: AC
  Filled 2024-10-01: qty 20

## 2024-10-01 MED ORDER — MIDAZOLAM HCL 2 MG/2ML IJ SOLN
INTRAMUSCULAR | Status: AC
  Filled 2024-10-01: qty 2

## 2024-10-01 MED ORDER — ONDANSETRON HCL 4 MG/2ML IJ SOLN: 4.0000 mg | Freq: Four times a day (QID) | INTRAMUSCULAR | Status: DC | PRN

## 2024-10-01 MED ORDER — ONDANSETRON HCL 4 MG PO TABS: 4.0000 mg | ORAL_TABLET | Freq: Four times a day (QID) | ORAL | Status: DC | PRN

## 2024-10-01 MED ORDER — IOHEXOL 300 MG/ML  SOLN
80.0000 mL | Freq: Once | INTRAMUSCULAR | Status: AC | PRN
  Administered 2024-10-01: 80 mL via INTRAVENOUS

## 2024-10-01 MED ORDER — DEXMEDETOMIDINE HCL IN NACL 80 MCG/20ML IV SOLN
INTRAVENOUS | Status: DC | PRN
  Administered 2024-10-01 (×3): 8 ug via INTRAVENOUS

## 2024-10-01 MED ORDER — SODIUM CHLORIDE 0.45 % IV SOLN: INTRAVENOUS | Status: DC

## 2024-10-01 MED ORDER — MIDAZOLAM HCL (PF) 2 MG/2ML IJ SOLN
INTRAMUSCULAR | Status: DC | PRN
  Administered 2024-10-01: 2 mg via INTRAVENOUS

## 2024-10-01 MED ORDER — SODIUM CHLORIDE 0.9 % IV BOLUS
1000.0000 mL | Freq: Once | INTRAVENOUS | Status: AC
  Administered 2024-10-01: 1000 mL via INTRAVENOUS

## 2024-10-01 MED ORDER — CLINDAMYCIN PHOSPHATE 600 MG/50ML IV SOLN
600.0000 mg | Freq: Four times a day (QID) | INTRAVENOUS | Status: DC
  Administered 2024-10-01 – 2024-10-02 (×3): 600 mg via INTRAVENOUS
  Filled 2024-10-01 (×5): qty 50

## 2024-10-01 MED ORDER — FLORANEX PO PACK
1.0000 g | PACK | Freq: Three times a day (TID) | ORAL | Status: DC
  Administered 2024-10-02 – 2024-10-03 (×4): 1 g via ORAL
  Filled 2024-10-01 (×6): qty 1

## 2024-10-01 MED ORDER — DEXAMETHASONE SOD PHOSPHATE PF 10 MG/ML IJ SOLN
10.0000 mg | Freq: Once | INTRAMUSCULAR | Status: AC
  Administered 2024-10-01: 10 mg via INTRAVENOUS

## 2024-10-01 MED ORDER — ACETAMINOPHEN 325 MG PO TABS: 650.0000 mg | ORAL_TABLET | Freq: Four times a day (QID) | ORAL | Status: DC | PRN

## 2024-10-01 MED ORDER — KETOROLAC TROMETHAMINE 30 MG/ML IJ SOLN
30.0000 mg | Freq: Once | INTRAMUSCULAR | Status: AC
  Administered 2024-10-01: 30 mg via INTRAVENOUS
  Filled 2024-10-01: qty 1

## 2024-10-01 MED ORDER — PROPOFOL 10 MG/ML IV BOLUS
INTRAVENOUS | Status: DC | PRN
  Administered 2024-10-01: 200 mg via INTRAVENOUS

## 2024-10-01 MED ORDER — LIDOCAINE HCL (CARDIAC) PF 100 MG/5ML IV SOSY
PREFILLED_SYRINGE | INTRAVENOUS | Status: DC | PRN
  Administered 2024-10-01: 100 mg via INTRAVENOUS

## 2024-10-01 MED ORDER — ONDANSETRON HCL 4 MG/2ML IJ SOLN
INTRAMUSCULAR | Status: DC | PRN
  Administered 2024-10-01: 4 mg via INTRAVENOUS

## 2024-10-01 MED ORDER — FLEET ENEMA RE ENEM: 1.0000 | ENEMA | Freq: Once | RECTAL | Status: DC | PRN

## 2024-10-01 MED ORDER — HYDROCODONE-ACETAMINOPHEN 5-325 MG PO TABS: 1.0000 | ORAL_TABLET | ORAL | Status: DC | PRN

## 2024-10-01 MED ORDER — SENNOSIDES-DOCUSATE SODIUM 8.6-50 MG PO TABS: 1.0000 | ORAL_TABLET | Freq: Every evening | ORAL | Status: DC | PRN

## 2024-10-01 MED ORDER — BISACODYL 5 MG PO TBEC: 5.0000 mg | DELAYED_RELEASE_TABLET | Freq: Every day | ORAL | Status: DC | PRN

## 2024-10-01 MED ORDER — HYDROMORPHONE HCL 1 MG/ML IJ SOLN: 0.5000 mg | INTRAMUSCULAR | Status: DC | PRN

## 2024-10-01 MED ORDER — KETOROLAC TROMETHAMINE 30 MG/ML IJ SOLN
30.0000 mg | Freq: Four times a day (QID) | INTRAMUSCULAR | Status: DC | PRN
  Administered 2024-10-01 – 2024-10-02 (×2): 30 mg via INTRAVENOUS
  Filled 2024-10-01 (×2): qty 1

## 2024-10-01 MED ORDER — 0.9 % SODIUM CHLORIDE (POUR BTL) OPTIME
TOPICAL | Status: DC | PRN
  Administered 2024-10-01: 500 mL

## 2024-10-01 MED ORDER — BACID PO TABS: 2.0000 | ORAL_TABLET | Freq: Three times a day (TID) | ORAL | Status: DC

## 2024-10-01 MED ORDER — MAGNESIUM HYDROXIDE 400 MG/5ML PO SUSP: 30.0000 mL | Freq: Every day | ORAL | Status: DC | PRN

## 2024-10-01 MED ORDER — DEXAMETHASONE SOD PHOSPHATE PF 10 MG/ML IJ SOLN
4.0000 mg | Freq: Two times a day (BID) | INTRAMUSCULAR | Status: DC
  Administered 2024-10-02 – 2024-10-03 (×4): 4 mg via INTRAVENOUS

## 2024-10-01 MED ORDER — ONDANSETRON 4 MG PO TBDP: 4.0000 mg | ORAL_TABLET | Freq: Four times a day (QID) | ORAL | Status: DC | PRN

## 2024-10-01 MED ORDER — FENTANYL CITRATE (PF) 100 MCG/2ML IJ SOLN
25.0000 ug | INTRAMUSCULAR | Status: DC | PRN
  Administered 2024-10-01: 50 ug via INTRAVENOUS

## 2024-10-01 MED ORDER — HYDROCODONE-ACETAMINOPHEN 5-325 MG PO TABS
1.0000 | ORAL_TABLET | ORAL | Status: DC | PRN
  Filled 2024-10-01: qty 1

## 2024-10-01 MED ORDER — SUGAMMADEX SODIUM 200 MG/2ML IV SOLN
INTRAVENOUS | Status: DC | PRN
  Administered 2024-10-01: 400 mg via INTRAVENOUS

## 2024-10-01 SURGICAL SUPPLY — 10 items
ELECTRODE REM PT RTRN 9FT ADLT (ELECTROSURGICAL) ×1 IMPLANT
GAUZE 4X4 16PLY ~~LOC~~+RFID DBL (SPONGE) ×1 IMPLANT
GLOVE BIO SURGEON STRL SZ7.5 (GLOVE) ×1 IMPLANT
GOWN STRL REUS W/ TWL LRG LVL3 (GOWN DISPOSABLE) ×2 IMPLANT
MANIFOLD NEPTUNE II (INSTRUMENTS) ×1 IMPLANT
NS IRRIG 500ML POUR BTL (IV SOLUTION) ×1 IMPLANT
PACK HEAD/NECK (MISCELLANEOUS) ×1 IMPLANT
SOLN STERILE WATER 500 ML (IV SOLUTION) ×1 IMPLANT
SWAB CULTURE AMIES ANAERIB BLU (MISCELLANEOUS) IMPLANT
TRAP FLUID SMOKE EVACUATOR (MISCELLANEOUS) ×1 IMPLANT

## 2024-10-01 NOTE — ED Provider Notes (Signed)
 [facility] Emergency Department Provider Note     Event Date/Time   First MD Initiated Contact with Patient 10/01/24 1034     (approximate)   History   Dental Pain   HPI  Chad Perez is a 38 y.o. male presents to the ED for evaluation of swollen throat secondary to a dental infection. Patient reports he was supposed to have his tooth #32 extracted 2 days ago at the [facility] implant center due to erupted tooth and severe pain, however due to swelling the dentist office was not able to perform this procedure.  They prescribed him steroids and antibiotics to help with the swelling, but the patient reports swelling has increased.  He reports he is unable to open his mouth and tolerate his secretions.  He reports swelling noted to the right side of his ear down to his neck.  Denies fever and chills.  Endorses odynophagia.     Physical Exam   Triage Vital Signs: ED Triage Vitals  Encounter Vitals Group     BP 10/01/24 1026 (!) 146/92     Girls Systolic BP Percentile --      Girls Diastolic BP Percentile --      Boys Systolic BP Percentile --      Boys Diastolic BP Percentile --      Pulse Rate 10/01/24 1026 95     Resp 10/01/24 1026 18     Temp 10/01/24 1026 98 F (36.7 C)     Temp src --      SpO2 10/01/24 1026 100 %     Weight 10/01/24 1027 160 lb (72.6 kg)     Height 10/01/24 1027 6' 3 (1.905 m)     Head Circumference --      Peak Flow --      Pain Score 10/01/24 1027 10     Pain Loc --      Pain Education --      Exclude from Growth Chart --     Most recent vital signs: Vitals:   10/01/24 1615 10/01/24 1630  BP: (!) 128/92 125/76  Pulse: 78 76  Resp: 17 13  Temp:  98.2 F (36.8 C)  SpO2: 96% 98%    General Awake, no distress.  Muffled voice noted.  Patient consistently spitting into bag. HEENT NCAT.  CV:  Good peripheral perfusion.  RESP:  Normal effort.  LCTAB ABD:  No distention.  Other:  Limited oral and throat exam as  patient does appear to have trismus.  Mild tenderness to right jawline to palpation.   ED Results / Procedures / Treatments   Labs (all labs ordered are listed, but only abnormal results are displayed) Labs Reviewed  CBC WITH DIFFERENTIAL/PLATELET - Abnormal; Notable for the following components:      Result Value   WBC 22.3 (*)    Platelets 413 (*)    Neutro Abs 17.8 (*)    Monocytes Absolute 2.5 (*)    Abs Immature Granulocytes 0.19 (*)    All other components within normal limits  BASIC METABOLIC PANEL WITH GFR - Abnormal; Notable for the following components:   Glucose, Bld 114 (*)    All other components within normal limits  AEROBIC/ANAEROBIC CULTURE W GRAM STAIN (SURGICAL/DEEP WOUND)   RADIOLOGY  I personally viewed and evaluated these images as part of my medical decision making, as well as reviewing the written report by the radiologist.  CT Soft Tissue Neck W Contrast Result  Date: 10/01/2024 EXAM: CT NECK WITH CONTRAST 10/01/2024 12:01:32 PM TECHNIQUE: CT of the neck was performed with the administration of 80 mL of iohexol  (OMNIPAQUE ) 300 MG/ML solution. Multiplanar reformatted images are provided for review. Automated exposure control, iterative reconstruction, and/or weight based adjustment of the mA/kV was utilized to reduce the radiation dose to as low as reasonably achievable. COMPARISON: None available. CLINICAL HISTORY: Soft tissue infection suspected, neck, xray done; dental infection/tonsilar abscess. FINDINGS: AERODIGESTIVE TRACT: There is prominent soft tissue swelling involving the right submandibular and sublingual spaces/floor of mouth with extension into the submental and left submandibular regions and overlying subcutaneous soft tissues as well as posteriorly into the tongue base with preepiglottic edema and effacement of the right vallecula. Inflammation is also present in the right parapharyngeal space. A rim enhancing fluid collection is present in the  posterior right submandibular space along the lingual surface of the mandible adjacent to a carious third molar tooth with mild periapical lucency. This collection contains a small amount of gas and extends medially and inferiorly into the posterior right sublingual space. There is mild narrowing of the airway at the level of the tongue base. Pooled secretions are present inferiorly in the oropharynx and hypopharynx. There is no retropharyngeal fluid collection. SALIVARY GLANDS: Inflammation is present in the right greater than left submandibular spaces as described above. The submandibular and parotid glands themselves are unremarkable. THYROID: The thyroid is unremarkable. LYMPH NODES: Small but mildly enlarged submental and right submandibular and level II and III lymph nodes, likely reactive. SOFT TISSUES: Above-described soft tissue swelling which extends inferiorly in the anterior aspect of the right greater than left neck to the level of the thyroid. BRAIN, ORBITS, SINUSES AND MASTOIDS: No acute abnormality. LUNGS AND MEDIASTINUM: Paraseptal emphysema in the right greater than left lung apices. BONES: No acute osseous abnormality. IMPRESSION: 1. Trans-spatial inflammation/cellulitis involving the right lower face and neck, most notably the right submandibular and sublingual spaces where there is a 4 cm abscess. This likely reflects dental infection arising from the right third mandibular molar. 2. Emphysema. Electronically signed by: Dasie Hamburg MD 10/01/2024 12:34 PM EST RP Workstation: [id number]    PROCEDURES:  Critical Care performed: No  Procedures   MEDICATIONS ORDERED IN ED: Medications  oxyCODONE  (Oxy IR/ROXICODONE ) immediate release tablet 5 mg (has no administration in time range)    Or  oxyCODONE  (ROXICODONE ) 5 MG/5ML solution 5 mg (has no administration in time range)  fentaNYL  (SUBLIMAZE ) injection 25-50 mcg (50 mcg Intravenous Given 10/01/24 1625)  sodium chloride  0.9 % bolus  1,000 mL (0 mLs Intravenous Stopped 10/01/24 1309)  ketorolac  (TORADOL ) 30 MG/ML injection 30 mg (30 mg Intravenous Given 10/01/24 1111)  dexamethasone  (DECADRON ) injection 10 mg (10 mg Intravenous Given 10/01/24 1114)  iohexol  (OMNIPAQUE ) 300 MG/ML solution 80 mL (80 mLs Intravenous Contrast Given 10/01/24 1201)  cefTRIAXone  (ROCEPHIN ) 2 g in sodium chloride  0.9 % 100 mL IVPB (0 g Intravenous Stopped 10/01/24 1229)     IMPRESSION / MDM / ASSESSMENT AND PLAN / ED COURSE  I reviewed the triage vital signs and the nursing notes.                              Clinical Course as of 10/01/24 1644  Sun Oct 01, 2024  1141 CBC with Differential(!) WBC 22.3 - will order IV rocephin   [MH]  1240 CT Soft Tissue Neck W Contrast IMPRESSION: 1. Trans-spatial inflammation/cellulitis involving the right  lower face and neck, most notably the right submandibular and sublingual spaces where there is a 4 cm abscess. This likely reflects dental infection arising from the right third mandibular molar.   [MH]  1251 D/w Juengel, ENT, who will take patient to OR for I&D of soft tissue abscess. Npo.  [MH]  1300 On reassessment patient reports he is breathing fine however unable to tolerate his secretions.  Pain has improved with Toradol .  [MH]    Clinical Course User Index [MH] Margrette Monte A, PA-C    38 y.o. male presents to the emergency department for evaluation and treatment of acute throat swelling and pain secondary to dental infection. See HPI for further details.   Differential diagnosis includes, but is not limited to dental abscess, PTA, tonsillitis, cellulitis  Patient's presentation is most consistent with acute complicated illness / injury requiring diagnostic workup.  Patient presents with concerns of soft tissue neck infection.  He is initially hemodynamically stable and afebrile.  Physical exam findings are stated above and pertinent for trismus and constant spitting into it a enema bag.   CBC reveals elevated white blood cell count of 22.3.  CT of soft tissue neck reveals transfacial inflammation/cellulitis involving the right lower face and neck notable to the right submandibular and sublingual spaces where there is a 4 cm abscess.  I consulted with Dr. Juengel, ENT who will take patient to the OR for I&D procedure.  IV fluids, Decadron , Toradol  and Rocephin  administered for symptomatic control.  Patient is in stable condition at the time of transfer to OR.   FINAL CLINICAL IMPRESSION(S) / ED DIAGNOSES   Final diagnoses:  Dental abscess  Cellulitis and abscess of oral soft tissues   Rx / DC Orders   ED Discharge Orders     None       Note:  This document was prepared using Dragon voice recognition software and may include unintentional dictation errors.    Margrette, Bethann Qualley A, PA-C 10/01/24 1644    Dicky Anes, MD 10/05/24 [phone]

## 2024-10-01 NOTE — ED Notes (Signed)
 Patient appears more comfortable at this time. Patient is still spitting out phlegm. Voice is clear.

## 2024-10-01 NOTE — Transfer of Care (Signed)
 Immediate Anesthesia Transfer of Care Note  Patient: Chad Perez  Procedure(s) Performed: INCISION AND DRAINAGE, ABSCESS,periapical (Right)  Patient Location: PACU  Anesthesia Type:General  Level of Consciousness: awake, alert , and oriented  Airway & Oxygen Therapy: Patient Spontanous Breathing and Patient connected to nasal cannula oxygen  Post-op Assessment: Report given to RN, Post -op Vital signs reviewed and stable, and Patient moving all extremities  Post vital signs: Reviewed and stable  Last Vitals:  Vitals Value Taken Time  BP 138/102 10/01/24 16:00  Temp 36.3 C 10/01/24 15:58  Pulse 91 10/01/24 16:01  Resp 13 10/01/24 16:01  SpO2 99 % 10/01/24 16:01  Vitals shown include unfiled device data.  Last Pain:  Vitals:   10/01/24 1200  TempSrc: Oral  PainSc:          Complications: No notable events documented.

## 2024-10-01 NOTE — ED Notes (Signed)
 Report given to [facility] RN in surgery.

## 2024-10-01 NOTE — H&P (Addendum)
 History and Physical    Chad Perez FMW:[id number] DOB: June 20, 1986 DOA: 10/01/2024  PCP: Pcp, No (Confirm with patient/family/NH records and if not entered, this has to be entered at [facility] point of entry) Patient coming from: Home  I have personally briefly reviewed patient's old medical records in [facility]  Chief Complaint: Toothache  HPI: Chad Perez is a 38 y.o. male with no significant past medical history presented with worsening of tooth infection and dental abscess incision and drainage.  Patient has a pain of the right posterior mandibular tooth for few weeks and went to see dentist on Friday, but he was told that an infected tooth cannot be removed because of the infection was severe.  Patient was started on amoxicillin  and Medrol pack for the last 2 days however the pain has become more severe and right side of face became very swollen.  In addition he also developed trouble to swallowing food and liquid and this morning started to have trouble swallowing saliva.  He came to ED and CT showed 4 cm abscess in the right submandibular and sublingual space.  Patient was taken to the OR by ENT emergently to have I&D.  Patient tolerated procedure well.  Now patient reported significant improvement of the pain.   Review of Systems: As per HPI otherwise 14 point review of systems negative.    History reviewed. No pertinent past medical history.  History reviewed. No pertinent surgical history.   reports that he has been smoking. He has never used smokeless tobacco. He reports current alcohol use. He reports current drug use. Drug: Marijuana.  Allergies[1]  History reviewed. No pertinent family history.  Prior to Admission medications  Not on File    Physical Exam: Vitals:   10/01/24 1600 10/01/24 1615 10/01/24 1630 10/01/24 1645  BP: (!) 138/102 (!) 128/92 125/76 117/80  Pulse: 91 78 76 73  Resp: 15 17 13 13   Temp:   98.2 F (36.8 C)   TempSrc:      SpO2:  98% 96% 98% 96%  Weight:      Height:        Constitutional: NAD, calm, comfortable Vitals:   10/01/24 1600 10/01/24 1615 10/01/24 1630 10/01/24 1645  BP: (!) 138/102 (!) 128/92 125/76 117/80  Pulse: 91 78 76 73  Resp: 15 17 13 13   Temp:   98.2 F (36.8 C)   TempSrc:      SpO2: 98% 96% 98% 96%  Weight:      Height:       Eyes: PERRL, lids and conjunctivae normal ENMT: Mucous membranes are moist. Posterior pharynx clear of any exudate or lesions.infected posterior mandibular molar tooth with soft tissue surrounding and surgical opening, no bleeding.  Neck: normal, supple, no masses, no thyromegaly Respiratory: clear to auscultation bilaterally, no wheezing, no crackles. Normal respiratory effort. No accessory muscle use.  Cardiovascular: Regular rate and rhythm, no murmurs / rubs / gallops. No extremity edema. 2+ pedal pulses. No carotid bruits.  Abdomen: no tenderness, no masses palpated. No hepatosplenomegaly. Bowel sounds positive.  Musculoskeletal: no clubbing / cyanosis. No joint deformity upper and lower extremities. Good ROM, no contractures. Normal muscle tone.  Skin: no rashes, lesions, ulcers. No induration Neurologic: CN 2-12 grossly intact. Sensation intact, DTR normal. Strength 5/5 in all 4.  Psychiatric: Normal judgment and insight. Alert and oriented x 3. Normal mood.     Labs on Admission: I have personally reviewed following labs and imaging studies  CBC:  Recent Labs  Lab 10/01/24 1105  WBC 22.3*  NEUTROABS 17.8*  HGB 14.9  HCT 42.7  MCV 95.1  PLT 413*   Basic Metabolic Panel: Recent Labs  Lab 10/01/24 1105  NA 141  K 3.9  CL 104  CO2 26  GLUCOSE 114*  BUN 14  CREATININE 0.63  CALCIUM 9.7   GFR: Estimated Creatinine Clearance: 131.1 mL/min (by C-G formula based on SCr of 0.63 mg/dL). Liver Function Tests: No results for input(s): AST, ALT, ALKPHOS, BILITOT, PROT, ALBUMIN in the last 168 hours. No results for input(s): LIPASE,  AMYLASE in the last 168 hours. No results for input(s): AMMONIA in the last 168 hours. Coagulation Profile: No results for input(s): INR, PROTIME in the last 168 hours. Cardiac Enzymes: No results for input(s): CKTOTAL, CKMB, CKMBINDEX, TROPONINI in the last 168 hours. BNP (last 3 results) No results for input(s): PROBNP in the last 8760 hours. HbA1C: No results for input(s): HGBA1C in the last 72 hours. CBG: No results for input(s): GLUCAP in the last 168 hours. Lipid Profile: No results for input(s): CHOL, HDL, LDLCALC, TRIG, CHOLHDL, LDLDIRECT in the last 72 hours. Thyroid Function Tests: No results for input(s): TSH, T4TOTAL, FREET4, T3FREE, THYROIDAB in the last 72 hours. Anemia Panel: No results for input(s): VITAMINB12, FOLATE, FERRITIN, TIBC, IRON, RETICCTPCT in the last 72 hours. Urine analysis: No results found for: COLORURINE, APPEARANCEUR, LABSPEC, PHURINE, GLUCOSEU, HGBUR, BILIRUBINUR, KETONESUR, PROTEINUR, UROBILINOGEN, NITRITE, LEUKOCYTESUR  Radiological Exams on Admission: CT Soft Tissue Neck W Contrast Result Date: 10/01/2024 EXAM: CT NECK WITH CONTRAST 10/01/2024 12:01:32 PM TECHNIQUE: CT of the neck was performed with the administration of 80 mL of iohexol  (OMNIPAQUE ) 300 MG/ML solution. Multiplanar reformatted images are provided for review. Automated exposure control, iterative reconstruction, and/or weight based adjustment of the mA/kV was utilized to reduce the radiation dose to as low as reasonably achievable. COMPARISON: None available. CLINICAL HISTORY: Soft tissue infection suspected, neck, xray done; dental infection/tonsilar abscess. FINDINGS: AERODIGESTIVE TRACT: There is prominent soft tissue swelling involving the right submandibular and sublingual spaces/floor of mouth with extension into the submental and left submandibular regions and overlying subcutaneous soft tissues as well  as posteriorly into the tongue base with preepiglottic edema and effacement of the right vallecula. Inflammation is also present in the right parapharyngeal space. A rim enhancing fluid collection is present in the posterior right submandibular space along the lingual surface of the mandible adjacent to a carious third molar tooth with mild periapical lucency. This collection contains a small amount of gas and extends medially and inferiorly into the posterior right sublingual space. There is mild narrowing of the airway at the level of the tongue base. Pooled secretions are present inferiorly in the oropharynx and hypopharynx. There is no retropharyngeal fluid collection. SALIVARY GLANDS: Inflammation is present in the right greater than left submandibular spaces as described above. The submandibular and parotid glands themselves are unremarkable. THYROID: The thyroid is unremarkable. LYMPH NODES: Small but mildly enlarged submental and right submandibular and level II and III lymph nodes, likely reactive. SOFT TISSUES: Above-described soft tissue swelling which extends inferiorly in the anterior aspect of the right greater than left neck to the level of the thyroid. BRAIN, ORBITS, SINUSES AND MASTOIDS: No acute abnormality. LUNGS AND MEDIASTINUM: Paraseptal emphysema in the right greater than left lung apices. BONES: No acute osseous abnormality. IMPRESSION: 1. Trans-spatial inflammation/cellulitis involving the right lower face and neck, most notably the right submandibular and sublingual spaces where there is a 4  cm abscess. This likely reflects dental infection arising from the right third mandibular molar. 2. Emphysema. Electronically signed by: Dasie Hamburg MD 10/01/2024 12:34 PM EST RP Workstation: [id number]    EKG: None  Assessment/Plan Principal Problem:   Dental abscess  (please populate well all problems here in Problem List. (For example, if patient is on BP meds at home and you resume or decide  to hold them, it is a problem that needs to be her. Same for CAD, COPD, HLD and so on)  Dental abscess status post I&D Acute oropharyngeal dysphagia -Status post emergency I&D. -Symptoms are improving - Discussed with ENT, who recommended IV antibiotics clindamycin  600 mg 4 times a day for today and tomorrow, and Decadron  4 mg twice daily for today, if patient continues to improve likely can go home on Tuesday.  On the day of discharge, ENT recommended 7 days course of clindamycin  300 mg 4 times daily and tapering dose of prednisone  from Tuesday.  ENT also recommended patient follow-up with his dentist in 7 days to have infected more teeth removed.  I explained the plan to the patient who expressed understanding and agreed. - Full liquid diet for today and can be advanced tomorrow if symptoms improved and white count comes down.  In the recommended rinse of mouth with water after eating every time. - Pain control with Toradol  as needed. - ENT signed off on the case, reconsult if condition changes.  Leukocytosis - Secondary to dental abscess, management as above.  DVT prophylaxis: SCD Code Status: Full code Family Communication: None at bedside Disposition Plan: Patient sick with dental abscess requiring IV antibiotics and IV steroid, expect more than 2 midnight hospital stay. Consults called: ENT Admission status: MedSurg admission   Cort ONEIDA Mana MD [facility] Pager [phone]  10/01/2024, 4:50 PM        [1] No Known Allergies

## 2024-10-01 NOTE — Anesthesia Preprocedure Evaluation (Addendum)
 Anesthesia Evaluation  Patient identified by MRN, date of birth, ID band Patient awake    Reviewed: Allergy & Precautions, NPO status , Patient's Chart, lab work & pertinent test results  History of Anesthesia Complications Negative for: history of anesthetic complications  Airway Mallampati: IV  TM Distance: >3 FB Neck ROM: full  Mouth opening: Limited Mouth Opening  Dental no notable dental hx.    Pulmonary Current Smoker and Patient abstained from smoking.   Pulmonary exam normal        Cardiovascular negative cardio ROS Normal cardiovascular exam     Neuro/Psych negative neurological ROS  negative psych ROS   GI/Hepatic negative GI ROS, Neg liver ROS,,,  Endo/Other  negative endocrine ROS    Renal/GU negative Renal ROS     Musculoskeletal   Abdominal   Peds  Hematology negative hematology ROS (+)   Anesthesia Other Findings History reviewed. No pertinent past medical history.  History reviewed. No pertinent surgical history.  BMI    Body Mass Index: 20.00 kg/m      Reproductive/Obstetrics negative OB ROS                              Anesthesia Physical Anesthesia Plan  ASA: 2  Anesthesia Plan: General ETT   Post-op Pain Management: Toradol  IV (intra-op)* and Ofirmev  IV (intra-op)*   Induction: Intravenous  PONV Risk Score and Plan: 2 and Ondansetron , Dexamethasone , Midazolam  and Treatment may vary due to age or medical condition  Airway Management Planned: Oral ETT  Additional Equipment:   Intra-op Plan:   Post-operative Plan: Extubation in OR  Informed Consent: I have reviewed the patients History and Physical, chart, labs and discussed the procedure including the risks, benefits and alternatives for the proposed anesthesia with the patient or authorized representative who has indicated his/her understanding and acceptance.     Dental Advisory Given  Plan  Discussed with: Anesthesiologist, CRNA and Surgeon  Anesthesia Plan Comments: (Patient consented for risks of anesthesia including but not limited to:  - adverse reactions to medications - damage to eyes, teeth, lips or other oral mucosa - nerve damage due to positioning  - sore throat or hoarseness - Damage to heart, brain, nerves, lungs, other parts of body or loss of life  Patient voiced understanding and assent.)         Anesthesia Quick Evaluation

## 2024-10-01 NOTE — Anesthesia Procedure Notes (Signed)
 Procedure Name: Intubation Date/Time: 10/01/2024 3:28 PM  Performed by: Landy Francena BIRCH, CRNAPre-anesthesia Checklist: Patient identified, Emergency Drugs available, Suction available and Patient being monitored Patient Re-evaluated:Patient Re-evaluated prior to induction Oxygen Delivery Method: Circle system utilized Preoxygenation: Pre-oxygenation with 100% oxygen Induction Type: IV induction Ventilation: Mask ventilation without difficulty Laryngoscope Size: McGrath and 4 Grade View: Grade I Tube type: Oral Rae Tube size: 8.0 mm Number of attempts: 1 Airway Equipment and Method: Stylet, Oral airway and Bite block Placement Confirmation: ETT inserted through vocal cords under direct vision, positive ETCO2 and breath sounds checked- equal and bilateral Secured at: 24 cm Tube secured with: Tape Dental Injury: Teeth and Oropharynx as per pre-operative assessment

## 2024-10-01 NOTE — ED Triage Notes (Signed)
 Pt comes with c/o dental pain and swollen throat. Pt states he went to dentist on Friday to get tooth pulled but they couldn't do it so they placed pt on meds and provided specialists. Pt states he can't eat or drink or swallow.

## 2024-10-01 NOTE — ED Notes (Signed)
 Pt dressed out for surgery   Belongings include: Black shoes Black socks Black shirt Black hoodie Black pants Black nike ski mask Black lighter with lighter leash 1 key Wallet with no cash Plaid underwear Cell phone

## 2024-10-01 NOTE — H&P (Signed)
 Perez,  Chad 38 y.o., male [id number]     Chief Complaint: Severe pain right neck and difficulty with swallowing  HPI: The patient is a 38 year old African-American male who is generally healthy.  He knows he has a bad tooth in the right posterior mandibular molar.  He saw his dentist 2 days ago for getting the tooth removed but it was too infected so he was started on amoxicillin  twice a day and a Medrol Dosepak.  Over the last 48 hours his pain has gotten worse and he is having more difficulty with swallowing.  He is currently spitting out his saliva.  He presented to the ER for further care.  He had a CT scan done in the ER which showed a periapical abscess on the inside of his right mandible extending down into the sublingual space.  Its length is about 2-1/2 cm but its width is about 1 cm.  It starts at the inside of the jaw adjacent to his right mandibular wisdom tooth and then extends more into the soft tissue and the sublingual space.  He has never had a problem like this before.  He has had nothing to eat or drink in more than 24 hours.  EFY:Ypdunmb reviewed. No pertinent past medical history.  Surg Yk:Ypdunmb reviewed. No pertinent surgical history.  FHx:  History reviewed. No pertinent family history. SocHx:  reports that he has been smoking. He has never used smokeless tobacco. He reports current alcohol use. He reports current drug use. Drug: Marijuana.  ALLERGIES: Allergies[1]  (Not in a hospital admission)   Results for orders placed or performed during the hospital encounter of 10/01/24 (from the past 48 hours)  CBC with Differential     Status: Abnormal   Collection Time: 10/01/24 11:05 AM  Result Value Ref Range   WBC 22.3 (H) 4.0 - 10.5 K/uL   RBC 4.49 4.22 - 5.81 MIL/uL   Hemoglobin 14.9 13.0 - 17.0 g/dL   HCT 57.2 60.9 - 47.9 %   MCV 95.1 80.0 - 100.0 fL   MCH 33.2 26.0 - 34.0 pg   MCHC 34.9 30.0 - 36.0 g/dL   RDW 85.9 88.4 - 84.4 %   Platelets 413 (H) 150 -  400 K/uL   nRBC 0.0 0.0 - 0.2 %   Neutrophils Relative % 80 %   Neutro Abs 17.8 (H) 1.7 - 7.7 K/uL   Lymphocytes Relative 8 %   Lymphs Abs 1.7 0.7 - 4.0 K/uL   Monocytes Relative 11 %   Monocytes Absolute 2.5 (H) 0.1 - 1.0 K/uL   Eosinophils Relative 0 %   Eosinophils Absolute 0.0 0.0 - 0.5 K/uL   Basophils Relative 0 %   Basophils Absolute 0.1 0.0 - 0.1 K/uL   Immature Granulocytes 1 %   Abs Immature Granulocytes 0.19 (H) 0.00 - 0.07 K/uL    Comment: Performed at [facility], [address] [place], [place] [address]  Basic metabolic panel     Status: Abnormal   Collection Time: 10/01/24 11:05 AM  Result Value Ref Range   Sodium 141 135 - 145 mmol/L   Potassium 3.9 3.5 - 5.1 mmol/L   Chloride 104 98 - 111 mmol/L   CO2 26 22 - 32 mmol/L   Glucose, Bld 114 (H) 70 - 99 mg/dL    Comment: Glucose reference range applies only to samples taken after fasting for at least 8 hours.   BUN 14 6 - 20 mg/dL   Creatinine, Ser 9.36  0.61 - 1.24 mg/dL   Calcium 9.7 8.9 - 89.6 mg/dL   GFR, Estimated >39 >39 mL/min    Comment: (NOTE) Calculated using the CKD-EPI Creatinine Equation (2021)    Anion gap 11 5 - 15    Comment: Performed at [facility], [address] [place], [place] [address]   CT Soft Tissue Neck W Contrast Result Date: 10/01/2024 EXAM: CT NECK WITH CONTRAST 10/01/2024 12:01:32 PM TECHNIQUE: CT of the neck was performed with the administration of 80 mL of iohexol  (OMNIPAQUE ) 300 MG/ML solution. Multiplanar reformatted images are provided for review. Automated exposure control, iterative reconstruction, and/or weight based adjustment of the mA/kV was utilized to reduce the radiation dose to as low as reasonably achievable. COMPARISON: None available. CLINICAL HISTORY: Soft tissue infection suspected, neck, xray done; dental infection/tonsilar abscess. FINDINGS: AERODIGESTIVE TRACT: There is prominent soft tissue swelling involving the right submandibular and  sublingual spaces/floor of mouth with extension into the submental and left submandibular regions and overlying subcutaneous soft tissues as well as posteriorly into the tongue base with preepiglottic edema and effacement of the right vallecula. Inflammation is also present in the right parapharyngeal space. A rim enhancing fluid collection is present in the posterior right submandibular space along the lingual surface of the mandible adjacent to a carious third molar tooth with mild periapical lucency. This collection contains a small amount of gas and extends medially and inferiorly into the posterior right sublingual space. There is mild narrowing of the airway at the level of the tongue base. Pooled secretions are present inferiorly in the oropharynx and hypopharynx. There is no retropharyngeal fluid collection. SALIVARY GLANDS: Inflammation is present in the right greater than left submandibular spaces as described above. The submandibular and parotid glands themselves are unremarkable. THYROID: The thyroid is unremarkable. LYMPH NODES: Small but mildly enlarged submental and right submandibular and level II and III lymph nodes, likely reactive. SOFT TISSUES: Above-described soft tissue swelling which extends inferiorly in the anterior aspect of the right greater than left neck to the level of the thyroid. BRAIN, ORBITS, SINUSES AND MASTOIDS: No acute abnormality. LUNGS AND MEDIASTINUM: Paraseptal emphysema in the right greater than left lung apices. BONES: No acute osseous abnormality. IMPRESSION: 1. Trans-spatial inflammation/cellulitis involving the right lower face and neck, most notably the right submandibular and sublingual spaces where there is a 4 cm abscess. This likely reflects dental infection arising from the right third mandibular molar. 2. Emphysema. Electronically signed by: Dasie Hamburg MD 10/01/2024 12:34 PM EST RP Workstation: [id number]    ROS: He has the pain in his throat when he tries to  swallow.  He is coughing up just clear mucus at times or it seems like it collects in his throat little bit.  He is not short of breath at all.  He otherwise feels fine.  Blood pressure (!) 135/91, pulse 75, temperature 98.9 F (37.2 C), temperature source Oral, resp. rate 18, height 6' 3 (1.905 m), weight 72.6 kg, SpO2 98%.  PHYSICAL EXAM: Overall appearance patient in his awake and alert and in no acute distress except when he tries to swallow he has sharp pain. Head: Normocephalic Ears: Clear Nose: Open and clear Oral Cavity: He has a full set of teeth upper and lower.  He has no swelling of the gingiva on the outside of his mandibular molars.  Feeling on the inside of his mandibular molars there is no swelling along the lower jaw but there is some there is you go further  back along the upper body of the mandible. Oral Pharynx/Hypopharynx/Larynx there is no swelling of his soft palate or uvula and he has good mobility of his tongue without pain during movement. Neuro: No obvious cranial nerve abnormalities Neck: He is tender under his right mandible and he can feel some swelling just medial to his submandibular gland.  This area is tender.  His gland is not swollen on either side.  There are no significant lymph nodes swollen in the neck.  Studies Reviewed: I reviewed the CT scan in detail and went over this with the patient so we could see the pictures in where the abscesses.  I agree with the reading by the radiologist.    Assessment/Plan He has a periapical abscess of his right mandibular wisdom tooth which is now drained medially through the bone and into the medial mandibular space and down into the sublingual space.  We discussed the option of draining it inside the mouth versus externally through the neck.  He would prefer not to have it done from the outside.  I think we can get it drained from inside and flush this out so it will clear  with IV antibiotics.  He should do well with  gargles and washing his mouth out, IV antibiotics, and Decadron  once the abscess has been drained.  Informed surgical risk consent is signed.  He will likely need to be in the hospital for 2 days and go home Tuesday morning.  He will likely need to be out of work all week long and hopefully can start back to work next week  [employer] 10/01/2024, 2:35 PM     [1] No Known Allergies

## 2024-10-01 NOTE — Op Note (Signed)
 10/01/2024  3:59 PM    Roeper, Jayson  [id number]   Pre-Op Dx: Right mandibular posterior molar tooth root abscess with extension into the neck to a sublingual abscess  Post-op Dx: Same  Proc: Incision and drainage of right periapical abscess extending into a lingual abscess.  This was drained intraorally.  Surg:  Deward DEL Tameko Halder  Anes:  GOT  EBL: 20 mL  Comp: None  Findings: There is swelling along the medial wall of the mandible near the angle of the jaw.  This extended down into the sublingual area about 2-1/2 cm.  Procedure: The patient was brought to the operating room and placed in supine position.  He was given general anesthesia by oral endotracheal intubation.  Once the patient was fully asleep a Dingman mouth blade was used with a #4 blade to hold the tongue back to visualize the inside of the mandible on his right side.  He can see a slight bulge along the mandible near the angle of the mandible and you could feel where it was slightly swollen underneath the mandible from the outside.  Using a guarded electrocautery tip a small incision was created at the area of swelling inside the mandible.  An incision was carried through mucosa and into the muscle lining the mandible.  A hemostat was used to widen this and I broke into the abscess and there was a significant amount of pus that started growing out.  A culture was taken of this pus for aerobic and anaerobic bacteria.  Suction was used to suction up all the pus and clear this out.  The hole was widened so I could get and Andrew suction all the way down into the anterior neck to make sure the entire deep section of the abscess was drained.  Then I flushed this a couple times with saline using a Angiocath from a 12-gauge IV.  There was no further pus that was visible or found.  There was no significant bleeding from the wound.  A drain was not placed into the wound should collapse on its own and heal well.  The patient tolerated  the procedure well.  He was awakened and taken to the recovery room in satisfactory condition.  There were no operative complications.    Dispo:   To PACU and then to be admitted to the hospital for 48 hours of IV antibiotics.  Plan: He should be able to be discharged home on Tuesday morning.  He should switch to clindamycin  IV 600 mg 3 times daily.  He can use some Decadron  tomorrow morning 4 mg IV and then 4 mg Monday evening.  He can switch to oral antibiotics Tuesday morning and a 5 mg prednisone  taper over 6 days to be discharged home.  He should be able to see his dentist next week and get this tooth removed to make sure his infection does not come back again  Deward DEL Argue  10/01/2024 3:59 PM

## 2024-10-01 NOTE — Anesthesia Postprocedure Evaluation (Signed)
 Anesthesia Post Note  Patient: Chad Perez  Procedure(s) Performed: INCISION AND DRAINAGE, ABSCESS,periapical (Right)  Patient location during evaluation: PACU Anesthesia Type: General Level of consciousness: awake and alert Pain management: pain level controlled Vital Signs Assessment: post-procedure vital signs reviewed and stable Respiratory status: spontaneous breathing, nonlabored ventilation, respiratory function stable and patient connected to nasal cannula oxygen Cardiovascular status: blood pressure returned to baseline and stable Postop Assessment: no apparent nausea or vomiting Anesthetic complications: no   No notable events documented.   Last Vitals:  Vitals:   10/01/24 1954 10/01/24 2023  BP: 114/78 119/78  Pulse: 66 71  Resp:  16  Temp: 37 C 36.7 C  SpO2: 99% 98%    Last Pain:  Vitals:   10/01/24 2023  TempSrc:   PainSc: 0-No pain                 [name]

## 2024-10-01 NOTE — ED Notes (Addendum)
 MD consented patient. Form was signed. OR transport is here. Patient is alert and oriented. No dyspnea noted.

## 2024-10-02 ENCOUNTER — Encounter: Payer: Self-pay | Admitting: Otolaryngology

## 2024-10-02 LAB — CBC WITH DIFFERENTIAL/PLATELET
Abs Immature Granulocytes: 0.16 K/uL — ABNORMAL HIGH (ref 0.00–0.07)
Basophils Absolute: 0 K/uL (ref 0.0–0.1)
Basophils Relative: 0 %
Eosinophils Absolute: 0 K/uL (ref 0.0–0.5)
Eosinophils Relative: 0 %
HCT: 41.1 % (ref 39.0–52.0)
Hemoglobin: 14.2 g/dL (ref 13.0–17.0)
Immature Granulocytes: 1 %
Lymphocytes Relative: 8 %
Lymphs Abs: 1.4 K/uL (ref 0.7–4.0)
MCH: 32.9 pg (ref 26.0–34.0)
MCHC: 34.5 g/dL (ref 30.0–36.0)
MCV: 95.1 fL (ref 80.0–100.0)
Monocytes Absolute: 1.1 K/uL — ABNORMAL HIGH (ref 0.1–1.0)
Monocytes Relative: 6 %
Neutro Abs: 16.2 K/uL — ABNORMAL HIGH (ref 1.7–7.7)
Neutrophils Relative %: 85 %
Platelets: 427 K/uL — ABNORMAL HIGH (ref 150–400)
RBC: 4.32 MIL/uL (ref 4.22–5.81)
RDW: 13.7 % (ref 11.5–15.5)
WBC: 19 K/uL — ABNORMAL HIGH (ref 4.0–10.5)
nRBC: 0 % (ref 0.0–0.2)

## 2024-10-02 LAB — HIV ANTIBODY (ROUTINE TESTING W REFLEX): HIV Screen 4th Generation wRfx: NONREACTIVE

## 2024-10-02 MED ORDER — ORAL CARE MOUTH RINSE: 15.0000 mL | OROMUCOSAL | Status: DC | PRN

## 2024-10-02 MED ORDER — SODIUM CHLORIDE 0.9 % IV SOLN
3.0000 g | Freq: Four times a day (QID) | INTRAVENOUS | Status: DC
  Administered 2024-10-02 – 2024-10-03 (×4): 3 g via INTRAVENOUS
  Filled 2024-10-02 (×6): qty 8

## 2024-10-02 MED ORDER — ENOXAPARIN SODIUM 40 MG/0.4ML IJ SOSY
40.0000 mg | PREFILLED_SYRINGE | INTRAMUSCULAR | Status: DC
  Administered 2024-10-02: 23:00:00 40 mg via SUBCUTANEOUS
  Filled 2024-10-02: qty 0.4

## 2024-10-02 NOTE — TOC CM/SW Note (Signed)
 Transition of Care [facility]) - Inpatient Brief Assessment   Patient Details  Name: Chad Perez MRN: [id number] Date of Birth: Mar 15, 1986  [facility] of [facility] CM/SW Contact:    Corean ONEIDA Haddock, RN Phone Number: 10/02/2024, 1:16 PM   Clinical Narrative:  Transition of Care Department [facility] has reviewed patient and no TOC needs have been identified at this time.  If new patient transition needs arise, please place a TOC consult.  List patient resides in [place].  Information for [employer] added to AVS.  [facility] outpatient pharmacy listed for dc medications   Transition of Care Asessment: Insurance and Status: Selfpay Patient has primary care physician: No     Prior/Current Home Services: No current home services Social Drivers of Health Review: SDOH reviewed no interventions necessary Readmission risk has been reviewed: Yes Transition of care needs: no transition of care needs at this time

## 2024-10-02 NOTE — Discharge Instructions (Signed)
 If you would like to establish care at a [facility] for Primary care [facility] can assist  [address], [place], [place] [address], [phone]

## 2024-10-02 NOTE — Progress Notes (Signed)
 PROGRESS NOTE    Chad Perez  FMW:[id number] DOB: April 19, 1986 DOA: 10/01/2024 PCP: Pcp, No    Assessment & Plan:   Principal Problem:   Dental abscess  Assessment and Plan: Dental abscess: s/p I&D. Abxs changed to IV unasyn  and steroid. Cx growing gram positive cocci, gram neg rods, sens pending. Will need to f/u outpatient w/ dentist ASAP. Pt verbalized his understanding   Acute oropharyngeal dysphagia  Leukocytosis: secondary to above infection. Continue on IV abxs        DVT prophylaxis: lovenox  Code Status: full  Family Communication:  Disposition Plan: likely d/c back home   Level of care: Med-Surg Consultants:    Procedures:   Antimicrobials: unasyn     Subjective: Pt c/o malaise  Objective: Vitals:   10/01/24 1707 10/01/24 1954 10/01/24 2023 10/02/24 0408  BP: 127/80 114/78 119/78 (!) 124/90  Pulse:  66 71 70  Resp: 14  16   Temp: 98.3 F (36.8 C) 98.6 F (37 C) 98.1 F (36.7 C) 97.6 F (36.4 C)  TempSrc: Oral   Oral  SpO2: 99% 99% 98% 99%  Weight:      Height:        Intake/Output Summary (Last 24 hours) at 10/02/2024 1019 Last data filed at 10/02/2024 0350 Gross per 24 hour  Intake 2232.77 ml  Output 5 ml  Net 2227.77 ml   Filed Weights   10/01/24 1027  Weight: 72.6 kg    Examination:  General exam: Appears calm and comfortable  Respiratory system: Clear to auscultation. Respiratory effort normal. Cardiovascular system: S1 & S2 +. No rubs, gallops or clicks.  Gastrointestinal system: Abdomen is nondistended, soft and nontender. Normal bowel sounds heard. Central nervous system: Alert and oriented. Moves all extremities Psychiatry: Judgement and insight appear normal. Mood & affect appropriate.     Data Reviewed: I have personally reviewed following labs and imaging studies  CBC: Recent Labs  Lab 10/01/24 1105 10/02/24 0422  WBC 22.3* 19.0*  NEUTROABS 17.8* 16.2*  HGB 14.9 14.2  HCT 42.7 41.1  MCV 95.1 95.1  PLT  413* 427*   Basic Metabolic Panel: Recent Labs  Lab 10/01/24 1105  NA 141  K 3.9  CL 104  CO2 26  GLUCOSE 114*  BUN 14  CREATININE 0.63  CALCIUM 9.7   GFR: Estimated Creatinine Clearance: 131.1 mL/min (by C-G formula based on SCr of 0.63 mg/dL). Liver Function Tests: No results for input(s): AST, ALT, ALKPHOS, BILITOT, PROT, ALBUMIN in the last 168 hours. No results for input(s): LIPASE, AMYLASE in the last 168 hours. No results for input(s): AMMONIA in the last 168 hours. Coagulation Profile: No results for input(s): INR, PROTIME in the last 168 hours. Cardiac Enzymes: No results for input(s): CKTOTAL, CKMB, CKMBINDEX, TROPONINI in the last 168 hours. BNP (last 3 results) No results for input(s): PROBNP in the last 8760 hours. HbA1C: No results for input(s): HGBA1C in the last 72 hours. CBG: No results for input(s): GLUCAP in the last 168 hours. Lipid Profile: No results for input(s): CHOL, HDL, LDLCALC, TRIG, CHOLHDL, LDLDIRECT in the last 72 hours. Thyroid Function Tests: No results for input(s): TSH, T4TOTAL, FREET4, T3FREE, THYROIDAB in the last 72 hours. Anemia Panel: No results for input(s): VITAMINB12, FOLATE, FERRITIN, TIBC, IRON, RETICCTPCT in the last 72 hours. Sepsis Labs: No results for input(s): PROCALCITON, LATICACIDVEN in the last 168 hours.  Recent Results (from the past 240 hours)  Aerobic/Anaerobic Culture w Gram Stain (surgical/deep wound)     Status:  None (Preliminary result)   Collection Time: 10/01/24  3:37 PM   Specimen: Path fluid; ENT  Result Value Ref Range Status   Specimen Description   Final    WOUND Performed at [facility], [address], [place], [place] [address]    Special Requests   Final    PERIAPICAL Performed at [facility], [address] [place], [place] [address]    Gram Stain   Final    ABUNDANT WBC PRESENT, PREDOMINANTLY  PMN ABUNDANT GRAM POSITIVE COCCI ABUNDANT GRAM NEGATIVE RODS    Culture   Final    CULTURE REINCUBATED FOR BETTER GROWTH Performed at [facility], [address] [place], [place] [address]    Report Status PENDING  Incomplete         Radiology Studies: CT Soft Tissue Neck W Contrast Result Date: 10/01/2024 EXAM: CT NECK WITH CONTRAST 10/01/2024 12:01:32 PM TECHNIQUE: CT of the neck was performed with the administration of 80 mL of iohexol  (OMNIPAQUE ) 300 MG/ML solution. Multiplanar reformatted images are provided for review. Automated exposure control, iterative reconstruction, and/or weight based adjustment of the mA/kV was utilized to reduce the radiation dose to as low as reasonably achievable. COMPARISON: None available. CLINICAL HISTORY: Soft tissue infection suspected, neck, xray done; dental infection/tonsilar abscess. FINDINGS: AERODIGESTIVE TRACT: There is prominent soft tissue swelling involving the right submandibular and sublingual spaces/floor of mouth with extension into the submental and left submandibular regions and overlying subcutaneous soft tissues as well as posteriorly into the tongue base with preepiglottic edema and effacement of the right vallecula. Inflammation is also present in the right parapharyngeal space. A rim enhancing fluid collection is present in the posterior right submandibular space along the lingual surface of the mandible adjacent to a carious third molar tooth with mild periapical lucency. This collection contains a small amount of gas and extends medially and inferiorly into the posterior right sublingual space. There is mild narrowing of the airway at the level of the tongue base. Pooled secretions are present inferiorly in the oropharynx and hypopharynx. There is no retropharyngeal fluid collection. SALIVARY GLANDS: Inflammation is present in the right greater than left submandibular spaces as described above. The submandibular and parotid  glands themselves are unremarkable. THYROID: The thyroid is unremarkable. LYMPH NODES: Small but mildly enlarged submental and right submandibular and level II and III lymph nodes, likely reactive. SOFT TISSUES: Above-described soft tissue swelling which extends inferiorly in the anterior aspect of the right greater than left neck to the level of the thyroid. BRAIN, ORBITS, SINUSES AND MASTOIDS: No acute abnormality. LUNGS AND MEDIASTINUM: Paraseptal emphysema in the right greater than left lung apices. BONES: No acute osseous abnormality. IMPRESSION: 1. Trans-spatial inflammation/cellulitis involving the right lower face and neck, most notably the right submandibular and sublingual spaces where there is a 4 cm abscess. This likely reflects dental infection arising from the right third mandibular molar. 2. Emphysema. Electronically signed by: Dasie Hamburg MD 10/01/2024 12:34 PM EST RP Workstation: [id number]        Scheduled Meds:  dexamethasone  (DECADRON ) injection  4 mg Intravenous Q12H   lactobacillus  1 g Oral TID WC   Continuous Infusions:  sodium chloride  75 mL/hr at 10/02/24 0350   ampicillin -sulbactam (UNASYN ) IV       LOS: 1 day       Anthony CHRISTELLA Pouch, MD [facility] Pager [phone]  If 7PM-7AM, please contact night-coverage [email or web address] 10/02/2024, 10:19 AM

## 2024-10-02 NOTE — Plan of Care (Signed)

## 2024-10-03 ENCOUNTER — Other Ambulatory Visit: Payer: Self-pay

## 2024-10-03 LAB — CBC
HCT: 39.3 % (ref 39.0–52.0)
Hemoglobin: 13.6 g/dL (ref 13.0–17.0)
MCH: 32.6 pg (ref 26.0–34.0)
MCHC: 34.6 g/dL (ref 30.0–36.0)
MCV: 94.2 fL (ref 80.0–100.0)
Platelets: 436 K/uL — ABNORMAL HIGH (ref 150–400)
RBC: 4.17 MIL/uL — ABNORMAL LOW (ref 4.22–5.81)
RDW: 13.7 % (ref 11.5–15.5)
WBC: 12.1 K/uL — ABNORMAL HIGH (ref 4.0–10.5)
nRBC: 0 % (ref 0.0–0.2)

## 2024-10-03 LAB — AEROBIC/ANAEROBIC CULTURE W GRAM STAIN (SURGICAL/DEEP WOUND): Culture: NORMAL

## 2024-10-03 MED ORDER — PREDNISONE 10 MG PO TABS
20.0000 mg | ORAL_TABLET | Freq: Every day | ORAL | 0 refills | Status: AC
  Filled 2024-10-03: qty 10, 5d supply, fill #0

## 2024-10-03 MED ORDER — AMOXICILLIN-POT CLAVULANATE 875-125 MG PO TABS
1.0000 | ORAL_TABLET | Freq: Two times a day (BID) | ORAL | 0 refills | Status: AC
  Filled 2024-10-03: qty 10, 5d supply, fill #0

## 2024-10-03 NOTE — Discharge Summary (Signed)
 Physician Discharge Summary  Chad Perez FMW:[id number] DOB: [birth year] DOA: 10/01/2024  PCP: Pcp, No  Admit date: 10/01/2024 Discharge date: 10/03/2024  Admitted From: home  Disposition:  home   Recommendations for Outpatient Follow-up:  Follow up with PCP in 1-2 weeks F/u w/ dentist ASAP   Home Health: no  Equipment/Devices:  Discharge Condition: stable  CODE STATUS: full  Diet recommendation: Regular  Brief/Interim Summary: HPI was taken from Dr. Laurita: Chad Perez is a 38 y.o. male with no significant past medical history presented with worsening of tooth infection and dental abscess incision and drainage.   Patient has a pain of the right posterior mandibular tooth for few weeks and went to see dentist on Friday, but he was told that an infected tooth cannot be removed because of the infection was severe.  Patient was started on amoxicillin  and Medrol pack for the last 2 days however the pain has become more severe and right side of face became very swollen.  In addition he also developed trouble to swallowing food and liquid and this morning started to have trouble swallowing saliva.  He came to ED and CT showed 4 cm abscess in the right submandibular and sublingual space.  Patient was taken to the OR by ENT emergently to have I&D.  Patient tolerated procedure well.  Now patient reported significant improvement of the pain  Discharge Diagnoses:  Principal Problem:   Dental abscess  Dental abscess: s/p I&D. Abxs changed to IV unasyn  and steroid. D/c home on augmentin , steroids. Cx growing gram positive cocci, gram neg rods, normal oropharyngeal flora so far. Will need to f/u outpatient w/ dentist ASAP. Pt verbalized his understanding   Acute oropharyngeal dysphagia: resolved   Leukocytosis: secondary to above infection. Continue on abxs  Discharge Instructions  Discharge Instructions     Diet general   Complete by: As directed    Discharge instructions    Complete by: As directed    F/u w/ PCP in 1-2 weeks. F/u w/ dentist as soon as possible   Increase activity slowly   Complete by: As directed       Allergies as of 10/03/2024   No Known Allergies      Medication List     STOP taking these medications    clindamycin  300 MG capsule Commonly known as: CLEOCIN    methylPREDNISolone 4 MG Tbpk tablet Commonly known as: MEDROL DOSEPAK       TAKE these medications    amoxicillin -clavulanate 875-125 MG tablet Commonly known as: AUGMENTIN  Take 1 tablet by mouth 2 (two) times daily for 5 days.   predniSONE  10 MG tablet Commonly known as: DELTASONE  Take 2 tablets (20 mg total) by mouth daily for 5 days.        Allergies[1]  Consultations: ENT   Procedures/Studies: CT Soft Tissue Neck W Contrast Result Date: 10/01/2024 EXAM: CT NECK WITH CONTRAST 10/01/2024 12:01:32 PM TECHNIQUE: CT of the neck was performed with the administration of 80 mL of iohexol  (OMNIPAQUE ) 300 MG/ML solution. Multiplanar reformatted images are provided for review. Automated exposure control, iterative reconstruction, and/or weight based adjustment of the mA/kV was utilized to reduce the radiation dose to as low as reasonably achievable. COMPARISON: None available. CLINICAL HISTORY: Soft tissue infection suspected, neck, xray done; dental infection/tonsilar abscess. FINDINGS: AERODIGESTIVE TRACT: There is prominent soft tissue swelling involving the right submandibular and sublingual spaces/floor of mouth with extension into the submental and left submandibular regions and overlying subcutaneous soft tissues as well  as posteriorly into the tongue base with preepiglottic edema and effacement of the right vallecula. Inflammation is also present in the right parapharyngeal space. A rim enhancing fluid collection is present in the posterior right submandibular space along the lingual surface of the mandible adjacent to a carious third molar tooth with mild  periapical lucency. This collection contains a small amount of gas and extends medially and inferiorly into the posterior right sublingual space. There is mild narrowing of the airway at the level of the tongue base. Pooled secretions are present inferiorly in the oropharynx and hypopharynx. There is no retropharyngeal fluid collection. SALIVARY GLANDS: Inflammation is present in the right greater than left submandibular spaces as described above. The submandibular and parotid glands themselves are unremarkable. THYROID: The thyroid is unremarkable. LYMPH NODES: Small but mildly enlarged submental and right submandibular and level II and III lymph nodes, likely reactive. SOFT TISSUES: Above-described soft tissue swelling which extends inferiorly in the anterior aspect of the right greater than left neck to the level of the thyroid. BRAIN, ORBITS, SINUSES AND MASTOIDS: No acute abnormality. LUNGS AND MEDIASTINUM: Paraseptal emphysema in the right greater than left lung apices. BONES: No acute osseous abnormality. IMPRESSION: 1. Trans-spatial inflammation/cellulitis involving the right lower face and neck, most notably the right submandibular and sublingual spaces where there is a 4 cm abscess. This likely reflects dental infection arising from the right third mandibular molar. 2. Emphysema. Electronically signed by: Dasie Hamburg MD 10/01/2024 12:34 PM EST RP Workstation: [id number]   (Echo, Carotid, EGD, Colonoscopy, ERCP)    Subjective: Pt c/o fatigue    Discharge Exam: Vitals:   10/03/24 0346 10/03/24 0748  BP: 123/84 127/83  Pulse: (!) 54 67  Resp: 16 18  Temp: (!) 97.5 F (36.4 C) 97.9 F (36.6 C)  SpO2: 99% 100%   Vitals:   10/02/24 1523 10/02/24 2022 10/03/24 0346 10/03/24 0748  BP: 124/85 119/81 123/84 127/83  Pulse: 75 65 (!) 54 67  Resp: 18 16 16 18   Temp: 98.9 F (37.2 C) 98.5 F (36.9 C) (!) 97.5 F (36.4 C) 97.9 F (36.6 C)  TempSrc:    Oral  SpO2: 99% 100% 99% 100%  Weight:       Height:        General: Pt is alert, awake, not in acute distress Cardiovascular:  S1/S2 +, no rubs, no gallops Respiratory: CTA bilaterally, no wheezing, no rhonchi Abdominal: Soft, NT, ND, bowel sounds + Extremities: no edema, no cyanosis    The results of significant diagnostics from this hospitalization (including imaging, microbiology, ancillary and laboratory) are listed below for reference.     Microbiology: Recent Results (from the past 240 hours)  Aerobic/Anaerobic Culture w Gram Stain (surgical/deep wound)     Status: None (Preliminary result)   Collection Time: 10/01/24  3:37 PM   Specimen: Path fluid; ENT  Result Value Ref Range Status   Specimen Description   Final    WOUND Performed at [facility], [address], [place], [place] [address]    Special Requests   Final    PERIAPICAL Performed at [facility], [address] [place], [place] [address]    Gram Stain   Final    ABUNDANT WBC PRESENT, PREDOMINANTLY PMN ABUNDANT GRAM POSITIVE COCCI ABUNDANT GRAM NEGATIVE RODS    Culture   Final    NORMAL OROPHARYNGEAL FLORA Performed at [facility], [address], [place], [place] [address]    Report Status PENDING  Incomplete     Labs: BNP (last 3 results) No results for input(s): BNP in the last 8760 hours. Basic Metabolic Panel: Recent Labs  Lab 10/01/24 1105  NA 141  K 3.9  CL 104  CO2 26  GLUCOSE 114*  BUN 14  CREATININE 0.63  CALCIUM 9.7   Liver Function Tests: No results for input(s): AST, ALT, ALKPHOS, BILITOT, PROT, ALBUMIN in the last 168 hours. No results for input(s): LIPASE, AMYLASE in the last 168 hours. No results for input(s): AMMONIA in the last 168 hours. CBC: Recent Labs  Lab 10/01/24 1105 10/02/24 0422 10/03/24 0344  WBC 22.3* 19.0* 12.1*  NEUTROABS 17.8* 16.2*  --   HGB 14.9 14.2 13.6  HCT 42.7 41.1 39.3  MCV 95.1 95.1 94.2  PLT 413* 427* 436*   Cardiac  Enzymes: No results for input(s): CKTOTAL, CKMB, CKMBINDEX, TROPONINI in the last 168 hours. BNP: Invalid input(s): POCBNP CBG: No results for input(s): GLUCAP in the last 168 hours. D-Dimer No results for input(s): DDIMER in the last 72 hours. Hgb A1c No results for input(s): HGBA1C in the last 72 hours. Lipid Profile No results for input(s): CHOL, HDL, LDLCALC, TRIG, CHOLHDL, LDLDIRECT in the last 72 hours. Thyroid function studies No results for input(s): TSH, T4TOTAL, T3FREE, THYROIDAB in the last 72 hours.  Invalid input(s): FREET3 Anemia work up No results for input(s): VITAMINB12, FOLATE, FERRITIN, TIBC, IRON, RETICCTPCT in the last 72 hours. Urinalysis No results found for: COLORURINE, APPEARANCEUR, LABSPEC, PHURINE, GLUCOSEU, HGBUR, BILIRUBINUR, KETONESUR, PROTEINUR, UROBILINOGEN, NITRITE, LEUKOCYTESUR Sepsis Labs Recent Labs  Lab 10/01/24 1105 10/02/24 0422 10/03/24 0344  WBC 22.3* 19.0* 12.1*   Microbiology Recent Results (from the past 240 hours)  Aerobic/Anaerobic Culture w Gram Stain (surgical/deep wound)     Status: None (Preliminary result)   Collection Time: 10/01/24  3:37 PM   Specimen: Path fluid; ENT  Result Value Ref Range Status   Specimen Description   Final    WOUND Performed at [facility], [address], [place], [place] [address]    Special Requests   Final    PERIAPICAL Performed at [facility], [address] [place], [place] [address]    Gram Stain   Final    ABUNDANT WBC PRESENT, PREDOMINANTLY PMN ABUNDANT GRAM POSITIVE COCCI ABUNDANT GRAM NEGATIVE RODS    Culture   Final    NORMAL OROPHARYNGEAL FLORA Performed at [facility], [address] [place], [place] [address]    Report Status PENDING  Incomplete     Time coordinating discharge: 35 minutes  SIGNED:   Anthony CHRISTELLA Pouch, MD  [facility] 10/03/2024, 11:46  AM Pager   If 7PM-7AM, please contact night-coverage      [1] No Known Allergies

## 2024-11-10 ENCOUNTER — Ambulatory Visit

## 2024-11-13 ENCOUNTER — Ambulatory Visit: Payer: Self-pay

## 2025-02-05 ENCOUNTER — Ambulatory Visit: Admitting: Family Medicine
